# Patient Record
Sex: Female | Born: 1985 | Race: White | Hispanic: No | Marital: Married | Smoking: Current every day smoker
Health system: Southern US, Community
[De-identification: ages and names within clinical notes are randomized; demographics above are authoritative.]

## PROBLEM LIST (undated history)

## (undated) DIAGNOSIS — K219 Gastro-esophageal reflux disease without esophagitis: Secondary | ICD-10-CM

## (undated) DIAGNOSIS — I951 Orthostatic hypotension: Secondary | ICD-10-CM

## (undated) DIAGNOSIS — F419 Anxiety disorder, unspecified: Secondary | ICD-10-CM

## (undated) DIAGNOSIS — Z1509 Genetic susceptibility to other malignant neoplasm: Secondary | ICD-10-CM

## (undated) DIAGNOSIS — K449 Diaphragmatic hernia without obstruction or gangrene: Secondary | ICD-10-CM

## (undated) DIAGNOSIS — F329 Major depressive disorder, single episode, unspecified: Secondary | ICD-10-CM

## (undated) DIAGNOSIS — R51 Headache: Secondary | ICD-10-CM

## (undated) DIAGNOSIS — C801 Malignant (primary) neoplasm, unspecified: Secondary | ICD-10-CM

## (undated) DIAGNOSIS — F32A Depression, unspecified: Secondary | ICD-10-CM

## (undated) DIAGNOSIS — K589 Irritable bowel syndrome without diarrhea: Secondary | ICD-10-CM

## (undated) DIAGNOSIS — L309 Dermatitis, unspecified: Secondary | ICD-10-CM

## (undated) DIAGNOSIS — K2970 Gastritis, unspecified, without bleeding: Secondary | ICD-10-CM

## (undated) DIAGNOSIS — E876 Hypokalemia: Secondary | ICD-10-CM

## (undated) DIAGNOSIS — G8929 Other chronic pain: Secondary | ICD-10-CM

## (undated) DIAGNOSIS — R000 Tachycardia, unspecified: Secondary | ICD-10-CM

## (undated) DIAGNOSIS — K5790 Diverticulosis of intestine, part unspecified, without perforation or abscess without bleeding: Secondary | ICD-10-CM

## (undated) DIAGNOSIS — J189 Pneumonia, unspecified organism: Secondary | ICD-10-CM

## (undated) DIAGNOSIS — Z1501 Genetic susceptibility to malignant neoplasm of breast: Secondary | ICD-10-CM

## (undated) DIAGNOSIS — R519 Headache, unspecified: Secondary | ICD-10-CM

## (undated) DIAGNOSIS — D649 Anemia, unspecified: Secondary | ICD-10-CM

## (undated) HISTORY — DX: Irritable bowel syndrome, unspecified: K58.9

## (undated) HISTORY — DX: Depression, unspecified: F32.A

## (undated) HISTORY — DX: Gastro-esophageal reflux disease without esophagitis: K21.9

## (undated) HISTORY — DX: Diverticulosis of intestine, part unspecified, without perforation or abscess without bleeding: K57.90

## (undated) HISTORY — DX: Anemia, unspecified: D64.9

## (undated) HISTORY — DX: Anxiety disorder, unspecified: F41.9

## (undated) HISTORY — DX: Diaphragmatic hernia without obstruction or gangrene: K44.9

## (undated) HISTORY — DX: Other chronic pain: G89.29

## (undated) HISTORY — DX: Malignant (primary) neoplasm, unspecified: C80.1

## (undated) HISTORY — DX: Genetic susceptibility to other malignant neoplasm: Z15.09

## (undated) HISTORY — PX: MASTECTOMY, PARTIAL: SHX709

## (undated) HISTORY — DX: Headache: R51

## (undated) HISTORY — DX: Genetic susceptibility to malignant neoplasm of breast: Z15.01

## (undated) HISTORY — DX: Headache, unspecified: R51.9

## (undated) HISTORY — PX: COSMETIC SURGERY: SHX468

## (undated) HISTORY — DX: Dermatitis, unspecified: L30.9

## (undated) HISTORY — PX: CHOLECYSTECTOMY: SHX55

## (undated) HISTORY — DX: Major depressive disorder, single episode, unspecified: F32.9

---
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
## 2003-01-04 ENCOUNTER — Ambulatory Visit (HOSPITAL_COMMUNITY): Admission: RE | Admit: 2003-01-04 | Discharge: 2003-01-04 | Payer: Self-pay | Admitting: Emergency Medicine

## 2003-01-04 ENCOUNTER — Encounter: Payer: Self-pay | Admitting: Emergency Medicine

## 2003-01-08 ENCOUNTER — Inpatient Hospital Stay (HOSPITAL_COMMUNITY): Admission: AD | Admit: 2003-01-08 | Discharge: 2003-01-08 | Payer: Self-pay | Admitting: Obstetrics and Gynecology

## 2003-02-15 ENCOUNTER — Inpatient Hospital Stay (HOSPITAL_COMMUNITY): Admission: AD | Admit: 2003-02-15 | Discharge: 2003-02-15 | Payer: Self-pay | Admitting: Obstetrics and Gynecology

## 2003-03-09 ENCOUNTER — Inpatient Hospital Stay (HOSPITAL_COMMUNITY): Admission: AD | Admit: 2003-03-09 | Discharge: 2003-03-09 | Payer: Self-pay | Admitting: Obstetrics and Gynecology

## 2003-03-22 ENCOUNTER — Inpatient Hospital Stay (HOSPITAL_COMMUNITY): Admission: AD | Admit: 2003-03-22 | Discharge: 2003-03-25 | Payer: Self-pay | Admitting: Obstetrics and Gynecology

## 2003-04-27 ENCOUNTER — Other Ambulatory Visit: Admission: RE | Admit: 2003-04-27 | Discharge: 2003-04-27 | Payer: Self-pay | Admitting: Obstetrics and Gynecology

## 2003-05-26 ENCOUNTER — Encounter (INDEPENDENT_AMBULATORY_CARE_PROVIDER_SITE_OTHER): Payer: Self-pay | Admitting: *Deleted

## 2003-05-26 ENCOUNTER — Ambulatory Visit (HOSPITAL_COMMUNITY): Admission: RE | Admit: 2003-05-26 | Discharge: 2003-05-26 | Payer: Self-pay | Admitting: Emergency Medicine

## 2003-05-26 ENCOUNTER — Encounter: Payer: Self-pay | Admitting: Emergency Medicine

## 2003-09-30 ENCOUNTER — Other Ambulatory Visit: Admission: RE | Admit: 2003-09-30 | Discharge: 2003-09-30 | Payer: Self-pay | Admitting: Obstetrics and Gynecology

## 2004-01-11 ENCOUNTER — Emergency Department (HOSPITAL_COMMUNITY): Admission: EM | Admit: 2004-01-11 | Discharge: 2004-01-11 | Payer: Self-pay | Admitting: Emergency Medicine

## 2004-03-19 ENCOUNTER — Emergency Department (HOSPITAL_COMMUNITY): Admission: EM | Admit: 2004-03-19 | Discharge: 2004-03-20 | Payer: Self-pay | Admitting: Emergency Medicine

## 2004-08-20 ENCOUNTER — Emergency Department (HOSPITAL_COMMUNITY): Admission: EM | Admit: 2004-08-20 | Discharge: 2004-08-21 | Payer: Self-pay | Admitting: Emergency Medicine

## 2004-08-22 ENCOUNTER — Emergency Department (HOSPITAL_COMMUNITY): Admission: EM | Admit: 2004-08-22 | Discharge: 2004-08-22 | Payer: Self-pay | Admitting: Emergency Medicine

## 2004-10-10 ENCOUNTER — Other Ambulatory Visit: Admission: RE | Admit: 2004-10-10 | Discharge: 2004-10-10 | Payer: Self-pay | Admitting: Obstetrics and Gynecology

## 2004-12-21 ENCOUNTER — Inpatient Hospital Stay (HOSPITAL_COMMUNITY): Admission: AD | Admit: 2004-12-21 | Discharge: 2004-12-21 | Payer: Self-pay | Admitting: Obstetrics and Gynecology

## 2005-02-04 ENCOUNTER — Inpatient Hospital Stay (HOSPITAL_COMMUNITY): Admission: AD | Admit: 2005-02-04 | Discharge: 2005-02-04 | Payer: Self-pay | Admitting: Obstetrics and Gynecology

## 2005-04-05 ENCOUNTER — Inpatient Hospital Stay (HOSPITAL_COMMUNITY): Admission: RE | Admit: 2005-04-05 | Discharge: 2005-04-08 | Payer: Self-pay | Admitting: Obstetrics and Gynecology

## 2005-05-17 ENCOUNTER — Other Ambulatory Visit: Admission: RE | Admit: 2005-05-17 | Discharge: 2005-05-17 | Payer: Self-pay | Admitting: Obstetrics and Gynecology

## 2005-09-27 ENCOUNTER — Inpatient Hospital Stay (HOSPITAL_COMMUNITY): Admission: AD | Admit: 2005-09-27 | Discharge: 2005-09-27 | Payer: Self-pay | Admitting: Obstetrics and Gynecology

## 2006-01-19 ENCOUNTER — Emergency Department (HOSPITAL_COMMUNITY): Admission: EM | Admit: 2006-01-19 | Discharge: 2006-01-20 | Payer: Self-pay | Admitting: Emergency Medicine

## 2006-01-21 ENCOUNTER — Emergency Department (HOSPITAL_COMMUNITY): Admission: EM | Admit: 2006-01-21 | Discharge: 2006-01-22 | Payer: Self-pay | Admitting: Emergency Medicine

## 2006-06-16 ENCOUNTER — Emergency Department (HOSPITAL_COMMUNITY): Admission: EM | Admit: 2006-06-16 | Discharge: 2006-06-16 | Payer: Self-pay | Admitting: Family Medicine

## 2006-07-08 ENCOUNTER — Emergency Department (HOSPITAL_COMMUNITY): Admission: EM | Admit: 2006-07-08 | Discharge: 2006-07-08 | Payer: Self-pay | Admitting: Family Medicine

## 2006-08-18 ENCOUNTER — Emergency Department (HOSPITAL_COMMUNITY): Admission: EM | Admit: 2006-08-18 | Discharge: 2006-08-18 | Payer: Self-pay | Admitting: Family Medicine

## 2006-08-19 ENCOUNTER — Inpatient Hospital Stay (HOSPITAL_COMMUNITY): Admission: EM | Admit: 2006-08-19 | Discharge: 2006-08-28 | Payer: Self-pay | Admitting: Emergency Medicine

## 2006-08-19 ENCOUNTER — Ambulatory Visit: Payer: Self-pay | Admitting: Internal Medicine

## 2006-08-29 ENCOUNTER — Encounter (INDEPENDENT_AMBULATORY_CARE_PROVIDER_SITE_OTHER): Payer: Self-pay | Admitting: Unknown Physician Specialty

## 2006-09-11 ENCOUNTER — Ambulatory Visit: Payer: Self-pay | Admitting: Internal Medicine

## 2006-09-11 ENCOUNTER — Encounter (INDEPENDENT_AMBULATORY_CARE_PROVIDER_SITE_OTHER): Payer: Self-pay | Admitting: Unknown Physician Specialty

## 2006-09-25 ENCOUNTER — Ambulatory Visit: Payer: Self-pay | Admitting: Internal Medicine

## 2006-09-26 ENCOUNTER — Ambulatory Visit: Payer: Self-pay | Admitting: Internal Medicine

## 2006-09-27 ENCOUNTER — Encounter (INDEPENDENT_AMBULATORY_CARE_PROVIDER_SITE_OTHER): Payer: Self-pay | Admitting: Unknown Physician Specialty

## 2006-10-23 ENCOUNTER — Emergency Department (HOSPITAL_COMMUNITY): Admission: EM | Admit: 2006-10-23 | Discharge: 2006-10-23 | Payer: Self-pay | Admitting: Emergency Medicine

## 2006-10-24 ENCOUNTER — Ambulatory Visit: Payer: Self-pay | Admitting: Internal Medicine

## 2006-10-31 ENCOUNTER — Ambulatory Visit: Payer: Self-pay | Admitting: Internal Medicine

## 2006-11-08 ENCOUNTER — Ambulatory Visit: Payer: Self-pay | Admitting: Internal Medicine

## 2006-12-13 DIAGNOSIS — F1910 Other psychoactive substance abuse, uncomplicated: Secondary | ICD-10-CM

## 2006-12-13 DIAGNOSIS — F329 Major depressive disorder, single episode, unspecified: Secondary | ICD-10-CM

## 2006-12-24 ENCOUNTER — Encounter (INDEPENDENT_AMBULATORY_CARE_PROVIDER_SITE_OTHER): Payer: Self-pay | Admitting: Internal Medicine

## 2006-12-24 ENCOUNTER — Ambulatory Visit: Payer: Self-pay | Admitting: Internal Medicine

## 2006-12-24 DIAGNOSIS — R55 Syncope and collapse: Secondary | ICD-10-CM | POA: Insufficient documentation

## 2006-12-24 DIAGNOSIS — R109 Unspecified abdominal pain: Secondary | ICD-10-CM

## 2006-12-24 DIAGNOSIS — R319 Hematuria, unspecified: Secondary | ICD-10-CM | POA: Insufficient documentation

## 2006-12-24 DIAGNOSIS — R1110 Vomiting, unspecified: Secondary | ICD-10-CM

## 2006-12-24 LAB — CONVERTED CEMR LAB
Leukocytes, UA: NEGATIVE
Nitrite: NEGATIVE
Specific Gravity, Urine: 1.009 (ref 1.005–1.03)
Urine Glucose: NEGATIVE mg/dL
pH: 6.5 (ref 5.0–8.0)

## 2007-01-08 ENCOUNTER — Telehealth: Payer: Self-pay | Admitting: *Deleted

## 2007-01-13 ENCOUNTER — Encounter (INDEPENDENT_AMBULATORY_CARE_PROVIDER_SITE_OTHER): Payer: Self-pay | Admitting: Unknown Physician Specialty

## 2007-01-15 ENCOUNTER — Encounter (INDEPENDENT_AMBULATORY_CARE_PROVIDER_SITE_OTHER): Payer: Self-pay | Admitting: Unknown Physician Specialty

## 2007-02-03 ENCOUNTER — Encounter (INDEPENDENT_AMBULATORY_CARE_PROVIDER_SITE_OTHER): Payer: Self-pay | Admitting: Unknown Physician Specialty

## 2007-02-03 ENCOUNTER — Ambulatory Visit: Payer: Self-pay | Admitting: Internal Medicine

## 2007-02-03 DIAGNOSIS — L708 Other acne: Secondary | ICD-10-CM

## 2007-02-03 DIAGNOSIS — L709 Acne, unspecified: Secondary | ICD-10-CM | POA: Insufficient documentation

## 2007-02-03 DIAGNOSIS — R635 Abnormal weight gain: Secondary | ICD-10-CM

## 2007-02-03 LAB — CONVERTED CEMR LAB
ALT: <8 units/L (ref 0–35)
AST: 12 units/L (ref 0–37)
Alkaline Phosphatase: 44 units/L (ref 39–117)
Beta hcg, urine, semiquantitative: NEGATIVE
Bilirubin, Direct: 0.1 mg/dL (ref 0.0–0.3)
Hemoglobin: 11.4 g/dL — ABNORMAL LOW (ref 12.0–15.0)
Indirect Bilirubin: 0.3 mg/dL (ref 0.0–0.9)
RBC: 4.35 M/uL (ref 3.87–5.11)
TSH: 0.514 microintl units/mL (ref 0.350–5.50)
Total Bilirubin: 0.4 mg/dL (ref 0.3–1.2)
WBC: 6.2 10*3/uL (ref 4.0–10.5)

## 2007-02-11 ENCOUNTER — Encounter (INDEPENDENT_AMBULATORY_CARE_PROVIDER_SITE_OTHER): Payer: Self-pay | Admitting: Unknown Physician Specialty

## 2007-03-06 ENCOUNTER — Ambulatory Visit: Payer: Self-pay | Admitting: Gastroenterology

## 2007-03-06 ENCOUNTER — Ambulatory Visit: Payer: Self-pay | Admitting: Internal Medicine

## 2007-03-11 ENCOUNTER — Telehealth (INDEPENDENT_AMBULATORY_CARE_PROVIDER_SITE_OTHER): Payer: Self-pay | Admitting: Internal Medicine

## 2007-03-11 ENCOUNTER — Emergency Department (HOSPITAL_COMMUNITY): Admission: EM | Admit: 2007-03-11 | Discharge: 2007-03-12 | Payer: Self-pay | Admitting: Emergency Medicine

## 2007-03-12 ENCOUNTER — Emergency Department (HOSPITAL_COMMUNITY): Admission: EM | Admit: 2007-03-12 | Discharge: 2007-03-13 | Payer: Self-pay | Admitting: Emergency Medicine

## 2007-03-13 ENCOUNTER — Encounter (INDEPENDENT_AMBULATORY_CARE_PROVIDER_SITE_OTHER): Payer: Self-pay | Admitting: *Deleted

## 2007-03-13 ENCOUNTER — Ambulatory Visit: Payer: Self-pay | Admitting: Internal Medicine

## 2007-03-13 LAB — CONVERTED CEMR LAB
Albumin: 3.8 g/dL (ref 3.5–5.2)
Amphetamine Screen, Ur: NEGATIVE
Anti Nuclear Antibody(ANA): NEGATIVE
BUN: 8 mg/dL (ref 6–23)
Barbiturate Quant, Ur: NEGATIVE
CO2: 24 meq/L (ref 19–32)
Cocaine Metabolites: NEGATIVE
Eosinophils Relative: 0 % (ref 0–5)
Glucose, Bld: 106 mg/dL — ABNORMAL HIGH (ref 70–99)
HCT: 34.6 % — ABNORMAL LOW (ref 36.0–46.0)
Hemoglobin: 11.8 g/dL — ABNORMAL LOW (ref 12.0–15.0)
Leukocytes, UA: NEGATIVE
Lymphocytes Relative: 18 % (ref 12–46)
Lymphs Abs: 2.5 10*3/uL (ref 0.7–3.3)
Marijuana Metabolite: NEGATIVE
Microalb Creat Ratio: 6.3 mg/g (ref 0.0–30.0)
Monocytes Relative: 5 % (ref 3–11)
Nitrite: NEGATIVE
Opiates: NEGATIVE
Platelets: 276 10*3/uL (ref 150–400)
Protein, ur: NEGATIVE mg/dL
RBC: 4.38 M/uL (ref 3.87–5.11)
Sodium: 136 meq/L (ref 135–145)
Total Bilirubin: 0.8 mg/dL (ref 0.3–1.2)
Total Protein: 6.8 g/dL (ref 6.0–8.3)
WBC: 13.9 10*3/uL — ABNORMAL HIGH (ref 4.0–10.5)

## 2007-03-17 ENCOUNTER — Encounter (INDEPENDENT_AMBULATORY_CARE_PROVIDER_SITE_OTHER): Payer: Self-pay | Admitting: *Deleted

## 2007-03-17 ENCOUNTER — Ambulatory Visit: Payer: Self-pay | Admitting: Hospitalist

## 2007-03-17 LAB — CONVERTED CEMR LAB
AST: 12 units/L (ref 0–37)
Albumin: 3.8 g/dL (ref 3.5–5.2)
Amphetamine Screen, Ur: NEGATIVE
BUN: 7 mg/dL (ref 6–23)
Basophils Relative: 0 % (ref 0–1)
Calcium: 8.9 mg/dL (ref 8.4–10.5)
Chloride: 106 meq/L (ref 96–112)
Cocaine Metabolites: NEGATIVE
Creatinine, Urine: 151.7 mg/dL
Ketones, ur: NEGATIVE mg/dL
Lymphs Abs: 2.1 10*3/uL (ref 0.7–3.3)
MCHC: 33.4 g/dL (ref 30.0–36.0)
Marijuana Metabolite: NEGATIVE
Microalb, Ur: 0.85 mg/dL (ref 0.00–1.89)
Monocytes Relative: 6 % (ref 3–11)
Neutro Abs: 2.2 10*3/uL (ref 1.7–7.7)
Neutrophils Relative %: 46 % (ref 43–77)
Nitrite: NEGATIVE
Opiates: NEGATIVE
Phencyclidine (PCP): NEGATIVE
Potassium: 3.5 meq/L (ref 3.5–5.3)
Protein, ur: NEGATIVE mg/dL
RBC: 4.2 M/uL (ref 3.87–5.11)
Specific Gravity, Urine: 1.018 (ref 1.005–1.03)
Total Protein: 6.6 g/dL (ref 6.0–8.3)
Urobilinogen, UA: 0.2 (ref 0.0–1.0)
WBC: 4.7 10*3/uL (ref 4.0–10.5)

## 2007-03-18 ENCOUNTER — Encounter (INDEPENDENT_AMBULATORY_CARE_PROVIDER_SITE_OTHER): Payer: Self-pay | Admitting: *Deleted

## 2007-03-18 ENCOUNTER — Ambulatory Visit: Payer: Self-pay | Admitting: Internal Medicine

## 2007-03-18 LAB — CONVERTED CEMR LAB
GC Probe Amp, Genital: NEGATIVE
Total CK: 96 units/L (ref 7–177)

## 2007-03-19 ENCOUNTER — Encounter (INDEPENDENT_AMBULATORY_CARE_PROVIDER_SITE_OTHER): Payer: Self-pay | Admitting: *Deleted

## 2007-03-19 LAB — CONVERTED CEMR LAB: Preg, Serum: NEGATIVE

## 2007-03-21 ENCOUNTER — Ambulatory Visit: Payer: Self-pay | Admitting: Hospitalist

## 2007-03-27 ENCOUNTER — Encounter (INDEPENDENT_AMBULATORY_CARE_PROVIDER_SITE_OTHER): Payer: Self-pay | Admitting: *Deleted

## 2007-04-03 ENCOUNTER — Telehealth: Payer: Self-pay | Admitting: *Deleted

## 2007-04-03 ENCOUNTER — Ambulatory Visit (HOSPITAL_COMMUNITY): Admission: RE | Admit: 2007-04-03 | Discharge: 2007-04-03 | Payer: Self-pay | Admitting: Internal Medicine

## 2007-04-18 ENCOUNTER — Encounter: Admission: RE | Admit: 2007-04-18 | Discharge: 2007-04-18 | Payer: Self-pay | Admitting: General Surgery

## 2007-04-24 ENCOUNTER — Encounter (INDEPENDENT_AMBULATORY_CARE_PROVIDER_SITE_OTHER): Payer: Self-pay | Admitting: Interventional Radiology

## 2007-04-24 ENCOUNTER — Other Ambulatory Visit: Admission: RE | Admit: 2007-04-24 | Discharge: 2007-04-24 | Payer: Self-pay | Admitting: Interventional Radiology

## 2007-04-24 ENCOUNTER — Encounter: Admission: RE | Admit: 2007-04-24 | Discharge: 2007-04-24 | Payer: Self-pay | Admitting: General Surgery

## 2007-04-24 ENCOUNTER — Emergency Department (HOSPITAL_COMMUNITY): Admission: EM | Admit: 2007-04-24 | Discharge: 2007-04-24 | Payer: Self-pay | Admitting: Emergency Medicine

## 2007-04-30 ENCOUNTER — Ambulatory Visit (HOSPITAL_COMMUNITY): Admission: RE | Admit: 2007-04-30 | Discharge: 2007-05-01 | Payer: Self-pay | Admitting: General Surgery

## 2007-04-30 ENCOUNTER — Encounter (INDEPENDENT_AMBULATORY_CARE_PROVIDER_SITE_OTHER): Payer: Self-pay | Admitting: General Surgery

## 2007-05-05 ENCOUNTER — Encounter (INDEPENDENT_AMBULATORY_CARE_PROVIDER_SITE_OTHER): Payer: Self-pay | Admitting: *Deleted

## 2007-08-18 ENCOUNTER — Encounter (INDEPENDENT_AMBULATORY_CARE_PROVIDER_SITE_OTHER): Payer: Self-pay | Admitting: Infectious Diseases

## 2007-08-18 ENCOUNTER — Ambulatory Visit: Payer: Self-pay | Admitting: Internal Medicine

## 2007-08-18 DIAGNOSIS — R1084 Generalized abdominal pain: Secondary | ICD-10-CM | POA: Insufficient documentation

## 2007-08-18 LAB — CONVERTED CEMR LAB
ALT: <8 units/L (ref 0–35)
Alkaline Phosphatase: 44 units/L (ref 39–117)
Beta hcg, urine, semiquantitative: NEGATIVE
Creatinine, Ser: 0.85 mg/dL (ref 0.40–1.20)
Gardnerella vaginalis: NEGATIVE
Glucose, Bld: 93 mg/dL (ref 70–99)
HCT: 39.1 % (ref 36.0–46.0)
Hemoglobin, Urine: NEGATIVE
Leukocytes, UA: NEGATIVE
MCHC: 32 g/dL (ref 30.0–36.0)
MCV: 84.8 fL (ref 78.0–100.0)
Nitrite: NEGATIVE
Platelets: 245 10*3/uL (ref 150–400)
Sodium: 140 meq/L (ref 135–145)
Specific Gravity, Urine: 1.009 (ref 1.005–1.03)
Total Bilirubin: 0.6 mg/dL (ref 0.3–1.2)
Total Protein: 7.5 g/dL (ref 6.0–8.3)
Trichomonal Vaginitis: NEGATIVE
Urine Glucose: NEGATIVE mg/dL
pH: 6 (ref 5.0–8.0)

## 2007-08-20 ENCOUNTER — Telehealth: Payer: Self-pay | Admitting: *Deleted

## 2007-09-20 ENCOUNTER — Emergency Department (HOSPITAL_COMMUNITY): Admission: EM | Admit: 2007-09-20 | Discharge: 2007-09-20 | Payer: Self-pay | Admitting: Emergency Medicine

## 2007-09-22 ENCOUNTER — Ambulatory Visit: Payer: Self-pay | Admitting: *Deleted

## 2007-09-22 ENCOUNTER — Encounter (INDEPENDENT_AMBULATORY_CARE_PROVIDER_SITE_OTHER): Payer: Self-pay | Admitting: *Deleted

## 2007-09-22 DIAGNOSIS — R519 Headache, unspecified: Secondary | ICD-10-CM | POA: Insufficient documentation

## 2007-09-22 DIAGNOSIS — Z87898 Personal history of other specified conditions: Secondary | ICD-10-CM | POA: Insufficient documentation

## 2007-09-22 DIAGNOSIS — R51 Headache: Secondary | ICD-10-CM | POA: Insufficient documentation

## 2007-09-22 DIAGNOSIS — Z8659 Personal history of other mental and behavioral disorders: Secondary | ICD-10-CM | POA: Insufficient documentation

## 2007-09-22 DIAGNOSIS — M549 Dorsalgia, unspecified: Secondary | ICD-10-CM | POA: Insufficient documentation

## 2007-09-25 LAB — CONVERTED CEMR LAB
Amphetamine Screen, Ur: NEGATIVE
Barbiturate Quant, Ur: NEGATIVE
Benzodiazepines.: NEGATIVE
CO2: 23 meq/L (ref 19–32)
Calcium: 8.7 mg/dL (ref 8.4–10.5)
Cocaine Metabolites: NEGATIVE
Creatinine, Ser: 0.83 mg/dL (ref 0.40–1.20)
Creatinine,U: 87.8 mg/dL
Phencyclidine (PCP): NEGATIVE
Sodium: 140 meq/L (ref 135–145)

## 2007-11-01 ENCOUNTER — Emergency Department (HOSPITAL_COMMUNITY): Admission: EM | Admit: 2007-11-01 | Discharge: 2007-11-02 | Payer: Self-pay | Admitting: Emergency Medicine

## 2007-11-03 ENCOUNTER — Telehealth (INDEPENDENT_AMBULATORY_CARE_PROVIDER_SITE_OTHER): Payer: Self-pay | Admitting: *Deleted

## 2007-11-04 ENCOUNTER — Telehealth (INDEPENDENT_AMBULATORY_CARE_PROVIDER_SITE_OTHER): Payer: Self-pay | Admitting: *Deleted

## 2007-11-05 ENCOUNTER — Emergency Department (HOSPITAL_COMMUNITY): Admission: EM | Admit: 2007-11-05 | Discharge: 2007-11-06 | Payer: Self-pay | Admitting: Emergency Medicine

## 2007-11-09 ENCOUNTER — Emergency Department (HOSPITAL_COMMUNITY): Admission: EM | Admit: 2007-11-09 | Discharge: 2007-11-09 | Payer: Self-pay | Admitting: Emergency Medicine

## 2007-12-01 ENCOUNTER — Ambulatory Visit: Payer: Self-pay | Admitting: Internal Medicine

## 2007-12-01 LAB — CONVERTED CEMR LAB: hCG, Beta Chain, Quant, S: <0.5 milliintl units/mL

## 2008-02-02 ENCOUNTER — Emergency Department (HOSPITAL_COMMUNITY): Admission: EM | Admit: 2008-02-02 | Discharge: 2008-02-03 | Payer: Self-pay | Admitting: Emergency Medicine

## 2008-02-22 ENCOUNTER — Inpatient Hospital Stay (HOSPITAL_COMMUNITY): Admission: AD | Admit: 2008-02-22 | Discharge: 2008-02-22 | Payer: Self-pay | Admitting: Obstetrics and Gynecology

## 2008-03-17 ENCOUNTER — Inpatient Hospital Stay (HOSPITAL_COMMUNITY): Admission: AD | Admit: 2008-03-17 | Discharge: 2008-03-17 | Payer: Self-pay | Admitting: Obstetrics and Gynecology

## 2008-04-07 ENCOUNTER — Encounter: Admission: RE | Admit: 2008-04-07 | Discharge: 2008-04-07 | Payer: Self-pay | Admitting: Endocrinology

## 2008-04-08 ENCOUNTER — Inpatient Hospital Stay (HOSPITAL_COMMUNITY): Admission: AD | Admit: 2008-04-08 | Discharge: 2008-04-08 | Payer: Self-pay | Admitting: Obstetrics and Gynecology

## 2008-04-12 ENCOUNTER — Ambulatory Visit (HOSPITAL_COMMUNITY): Admission: RE | Admit: 2008-04-12 | Discharge: 2008-04-12 | Payer: Self-pay | Admitting: Endocrinology

## 2008-04-12 ENCOUNTER — Encounter (INDEPENDENT_AMBULATORY_CARE_PROVIDER_SITE_OTHER): Payer: Self-pay | Admitting: Interventional Radiology

## 2008-04-15 ENCOUNTER — Inpatient Hospital Stay (HOSPITAL_COMMUNITY): Admission: AD | Admit: 2008-04-15 | Discharge: 2008-04-15 | Payer: Self-pay | Admitting: Obstetrics and Gynecology

## 2008-04-15 ENCOUNTER — Ambulatory Visit: Payer: Self-pay | Admitting: Internal Medicine

## 2008-05-23 ENCOUNTER — Inpatient Hospital Stay (HOSPITAL_COMMUNITY): Admission: AD | Admit: 2008-05-23 | Discharge: 2008-05-23 | Payer: Self-pay | Admitting: Obstetrics and Gynecology

## 2008-06-06 ENCOUNTER — Emergency Department (HOSPITAL_COMMUNITY): Admission: EM | Admit: 2008-06-06 | Discharge: 2008-06-07 | Payer: Self-pay | Admitting: Emergency Medicine

## 2008-06-06 ENCOUNTER — Emergency Department (HOSPITAL_COMMUNITY): Admission: EM | Admit: 2008-06-06 | Discharge: 2008-06-06 | Payer: Self-pay | Admitting: Family Medicine

## 2008-06-21 ENCOUNTER — Telehealth: Payer: Self-pay | Admitting: Internal Medicine

## 2008-08-21 ENCOUNTER — Inpatient Hospital Stay (HOSPITAL_COMMUNITY): Admission: AD | Admit: 2008-08-21 | Discharge: 2008-08-21 | Payer: Self-pay | Admitting: Obstetrics and Gynecology

## 2008-09-12 ENCOUNTER — Inpatient Hospital Stay (HOSPITAL_COMMUNITY): Admission: AD | Admit: 2008-09-12 | Discharge: 2008-09-13 | Payer: Self-pay | Admitting: Obstetrics and Gynecology

## 2008-09-20 ENCOUNTER — Inpatient Hospital Stay (HOSPITAL_COMMUNITY): Admission: AD | Admit: 2008-09-20 | Discharge: 2008-09-20 | Payer: Self-pay | Admitting: Obstetrics and Gynecology

## 2008-09-23 ENCOUNTER — Encounter: Admission: RE | Admit: 2008-09-23 | Discharge: 2008-09-23 | Payer: Self-pay | Admitting: Endocrinology

## 2008-10-01 ENCOUNTER — Inpatient Hospital Stay (HOSPITAL_COMMUNITY): Admission: AD | Admit: 2008-10-01 | Discharge: 2008-10-01 | Payer: Self-pay | Admitting: Obstetrics and Gynecology

## 2008-10-03 ENCOUNTER — Inpatient Hospital Stay (HOSPITAL_COMMUNITY): Admission: AD | Admit: 2008-10-03 | Discharge: 2008-10-03 | Payer: Self-pay | Admitting: Obstetrics and Gynecology

## 2008-10-11 ENCOUNTER — Inpatient Hospital Stay (HOSPITAL_COMMUNITY): Admission: RE | Admit: 2008-10-11 | Discharge: 2008-10-14 | Payer: Self-pay | Admitting: Obstetrics and Gynecology

## 2008-10-11 ENCOUNTER — Encounter (INDEPENDENT_AMBULATORY_CARE_PROVIDER_SITE_OTHER): Payer: Self-pay | Admitting: Obstetrics and Gynecology

## 2008-11-05 IMAGING — NM NM HEPATO W/GB/PHARM/[PERSON_NAME]
2 series · 12 of 12 positions shown · non-contrast
Comparison: None

CLINICAL DATA: 20-year-old, nausea, vomiting, abdominal pain.  
 NUCLEAR MEDICINE HEPATOBILIARY SCAN WITH EJECTION FRACTION:
TECHNIQUE: Sequential abdominal images were obtained following intravenous injection of radiopharmaceutical.  Sequential images were continued following oral ingestion of 8 oz. half-and-half, and the gallbladder ejection fraction was calculated.
 Radiopharmaceutical:  5.5 mCi Mc-DDm Choletec in 8 oz. of half-and-half.

[Series 1: he hepato · 4.75mm/px · 6 of 60 frames shown (1 of 2)]
[frame 6/60]
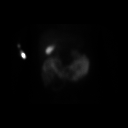
[frame 16/60]
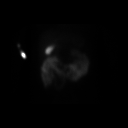
[frame 26/60]
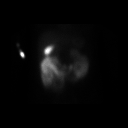
[frame 36/60]
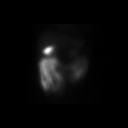
[frame 46/60]
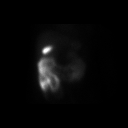
[frame 56/60]
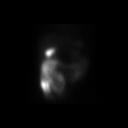

[Series 1: he hepato · 4.75mm/px · 6 of 60 frames shown (2 of 2)]
[frame 6/60]
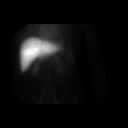
[frame 16/60]
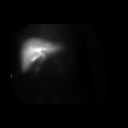
[frame 26/60]
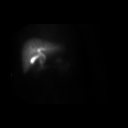
[frame 36/60]
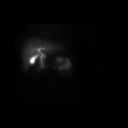
[frame 46/60]
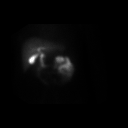
[frame 56/60]
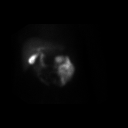

[12 of 12 positions shown; findings below may reference images not displayed]

FINDINGS: There is symmetric uptake in the liver.  There is fairly prompt excretion in the biliary tree which is visualized at 15 minutes.  Activity is seen in the small bowel by 30 minutes.  The gallbladder is seen at 20 minutes.  The patient?s calculated ejection fraction was 31%.  Normal is greater than 50%.
IMPRESSION: 1.  Normal biliary patency. 
 2.  Low gallbladder ejection fraction at 31%.  Normal should be greater than 50%.

## 2009-04-02 ENCOUNTER — Ambulatory Visit: Payer: Self-pay | Admitting: Diagnostic Radiology

## 2009-04-02 ENCOUNTER — Emergency Department (HOSPITAL_BASED_OUTPATIENT_CLINIC_OR_DEPARTMENT_OTHER): Admission: EM | Admit: 2009-04-02 | Discharge: 2009-04-02 | Payer: Self-pay | Admitting: Emergency Medicine

## 2009-09-07 ENCOUNTER — Ambulatory Visit: Payer: Self-pay | Admitting: Interventional Radiology

## 2009-09-07 ENCOUNTER — Emergency Department (HOSPITAL_BASED_OUTPATIENT_CLINIC_OR_DEPARTMENT_OTHER): Admission: EM | Admit: 2009-09-07 | Discharge: 2009-09-07 | Payer: Self-pay | Admitting: Emergency Medicine

## 2009-09-26 ENCOUNTER — Emergency Department (HOSPITAL_BASED_OUTPATIENT_CLINIC_OR_DEPARTMENT_OTHER): Admission: EM | Admit: 2009-09-26 | Discharge: 2009-09-26 | Payer: Self-pay | Admitting: Emergency Medicine

## 2009-09-26 ENCOUNTER — Ambulatory Visit: Payer: Self-pay | Admitting: Diagnostic Radiology

## 2009-10-10 ENCOUNTER — Emergency Department (HOSPITAL_BASED_OUTPATIENT_CLINIC_OR_DEPARTMENT_OTHER): Admission: EM | Admit: 2009-10-10 | Discharge: 2009-10-10 | Payer: Self-pay | Admitting: Emergency Medicine

## 2009-10-10 ENCOUNTER — Ambulatory Visit: Payer: Self-pay | Admitting: Diagnostic Radiology

## 2009-11-21 ENCOUNTER — Emergency Department (HOSPITAL_BASED_OUTPATIENT_CLINIC_OR_DEPARTMENT_OTHER): Admission: EM | Admit: 2009-11-21 | Discharge: 2009-11-21 | Payer: Self-pay | Admitting: Emergency Medicine

## 2010-05-16 ENCOUNTER — Ambulatory Visit: Payer: Self-pay | Admitting: Diagnostic Radiology

## 2010-05-16 ENCOUNTER — Emergency Department (HOSPITAL_BASED_OUTPATIENT_CLINIC_OR_DEPARTMENT_OTHER): Admission: EM | Admit: 2010-05-16 | Discharge: 2010-05-16 | Payer: Self-pay | Admitting: Emergency Medicine

## 2010-10-07 ENCOUNTER — Emergency Department (HOSPITAL_BASED_OUTPATIENT_CLINIC_OR_DEPARTMENT_OTHER): Admission: EM | Admit: 2010-10-07 | Discharge: 2010-10-08 | Payer: Self-pay | Admitting: Emergency Medicine

## 2011-02-11 LAB — URINE MICROSCOPIC-ADD ON

## 2011-02-11 LAB — URINALYSIS, ROUTINE W REFLEX MICROSCOPIC
Bilirubin Urine: NEGATIVE
Hgb urine dipstick: NEGATIVE
Hgb urine dipstick: NEGATIVE
Nitrite: NEGATIVE
Protein, ur: NEGATIVE mg/dL
Specific Gravity, Urine: 1.022 (ref 1.005–1.030)
Specific Gravity, Urine: 1.023 (ref 1.005–1.030)
Urobilinogen, UA: 1 mg/dL (ref 0.0–1.0)
Urobilinogen, UA: 1 mg/dL (ref 0.0–1.0)
pH: 8.5 — ABNORMAL HIGH (ref 5.0–8.0)

## 2011-02-11 LAB — DIFFERENTIAL
Basophils Absolute: 0 10*3/uL (ref 0.0–0.1)
Basophils Relative: 1 % (ref 0–1)
Monocytes Absolute: 0.3 10*3/uL (ref 0.1–1.0)
Neutro Abs: 7.1 10*3/uL (ref 1.7–7.7)
Neutrophils Relative %: 85 % — ABNORMAL HIGH (ref 43–77)

## 2011-02-11 LAB — BASIC METABOLIC PANEL
BUN: 10 mg/dL (ref 6–23)
CO2: 22 mEq/L (ref 19–32)
Calcium: 9.2 mg/dL (ref 8.4–10.5)
GFR calc non Af Amer: >60 mL/min (ref >60)
Glucose, Bld: 101 mg/dL — ABNORMAL HIGH (ref 70–99)
Sodium: 141 mEq/L (ref 135–145)

## 2011-02-11 LAB — HEPATIC FUNCTION PANEL
AST: 18 U/L (ref 0–37)
Bilirubin, Direct: 0 mg/dL (ref 0.0–0.3)
Total Protein: 8.3 g/dL (ref 6.0–8.3)

## 2011-02-11 LAB — PREGNANCY, URINE

## 2011-02-11 LAB — GC/CHLAMYDIA PROBE AMP, GENITAL: GC Probe Amp, Genital: NEGATIVE

## 2011-02-11 LAB — WET PREP, GENITAL
Trich, Wet Prep: NONE SEEN
Yeast Wet Prep HPF POC: NONE SEEN

## 2011-02-11 LAB — CBC
Hemoglobin: 12.8 g/dL (ref 12.0–15.0)
MCHC: 33.1 g/dL (ref 30.0–36.0)
Platelets: 169 10*3/uL (ref 150–400)
RDW: 13.8 % (ref 11.5–15.5)

## 2011-02-26 LAB — DIFFERENTIAL
Basophils Absolute: 0.1 10*3/uL (ref 0.0–0.1)
Basophils Relative: 1 % (ref 0–1)
Monocytes Absolute: 0.4 10*3/uL (ref 0.1–1.0)
Neutro Abs: 7.1 10*3/uL (ref 1.7–7.7)
Neutrophils Relative %: 78 % — ABNORMAL HIGH (ref 43–77)

## 2011-02-26 LAB — COMPREHENSIVE METABOLIC PANEL
Albumin: 4.5 g/dL (ref 3.5–5.2)
Alkaline Phosphatase: 55 U/L (ref 39–117)
BUN: 10 mg/dL (ref 6–23)
Chloride: 105 mEq/L (ref 96–112)
Glucose, Bld: 108 mg/dL — ABNORMAL HIGH (ref 70–99)
Potassium: 3.6 mEq/L (ref 3.5–5.1)
Total Bilirubin: 0.5 mg/dL (ref 0.3–1.2)

## 2011-02-26 LAB — URINALYSIS, ROUTINE W REFLEX MICROSCOPIC
Glucose, UA: NEGATIVE mg/dL
pH: 6 (ref 5.0–8.0)

## 2011-02-26 LAB — CBC
HCT: 33.8 % — ABNORMAL LOW (ref 36.0–46.0)
Hemoglobin: 11.5 g/dL — ABNORMAL LOW (ref 12.0–15.0)
WBC: 9.1 10*3/uL (ref 4.0–10.5)

## 2011-02-28 LAB — URINALYSIS, ROUTINE W REFLEX MICROSCOPIC
Bilirubin Urine: NEGATIVE
Bilirubin Urine: NEGATIVE
Glucose, UA: NEGATIVE mg/dL
Ketones, ur: NEGATIVE mg/dL
Ketones, ur: NEGATIVE mg/dL
Leukocytes, UA: NEGATIVE
Nitrite: NEGATIVE
Protein, ur: NEGATIVE mg/dL
Protein, ur: NEGATIVE mg/dL

## 2011-02-28 LAB — COMPREHENSIVE METABOLIC PANEL
ALT: 15 U/L (ref 0–35)
Alkaline Phosphatase: 52 U/L (ref 39–117)
BUN: 9 mg/dL (ref 6–23)
CO2: 25 mEq/L (ref 19–32)
Chloride: 106 mEq/L (ref 96–112)
GFR calc non Af Amer: >60 mL/min (ref >60)
Glucose, Bld: 94 mg/dL (ref 70–99)
Potassium: 3.9 mEq/L (ref 3.5–5.1)
Total Bilirubin: 0.5 mg/dL (ref 0.3–1.2)

## 2011-02-28 LAB — CBC
HCT: 33.6 % — ABNORMAL LOW (ref 36.0–46.0)
Hemoglobin: 11 g/dL — ABNORMAL LOW (ref 12.0–15.0)
RBC: 4.29 MIL/uL (ref 3.87–5.11)
RDW: 14.1 % (ref 11.5–15.5)
WBC: 5.2 10*3/uL (ref 4.0–10.5)

## 2011-02-28 LAB — DIFFERENTIAL
Basophils Absolute: 0 10*3/uL (ref 0.0–0.1)
Basophils Relative: 1 % (ref 0–1)
Eosinophils Absolute: 0.1 10*3/uL (ref 0.0–0.7)
Neutro Abs: 2.9 10*3/uL (ref 1.7–7.7)
Neutrophils Relative %: 54 % (ref 43–77)

## 2011-02-28 LAB — PREGNANCY, URINE: Preg Test, Ur: NEGATIVE

## 2011-02-28 LAB — RAPID STREP SCREEN (MED CTR MEBANE ONLY): Streptococcus, Group A Screen (Direct): NEGATIVE

## 2011-03-01 LAB — URINALYSIS, ROUTINE W REFLEX MICROSCOPIC
Bilirubin Urine: NEGATIVE
Glucose, UA: NEGATIVE mg/dL
Hgb urine dipstick: NEGATIVE
Specific Gravity, Urine: 1.016 (ref 1.005–1.030)
Urobilinogen, UA: 0.2 mg/dL (ref 0.0–1.0)

## 2011-03-06 LAB — COMPREHENSIVE METABOLIC PANEL
ALT: 11 U/L (ref 0–35)
CO2: 25 mEq/L (ref 19–32)
Calcium: 9.4 mg/dL (ref 8.4–10.5)
Chloride: 104 mEq/L (ref 96–112)
Creatinine, Ser: 0.9 mg/dL (ref 0.4–1.2)
GFR calc non Af Amer: >60 mL/min (ref >60)
Glucose, Bld: 88 mg/dL (ref 70–99)
Sodium: 138 mEq/L (ref 135–145)
Total Bilirubin: 0.9 mg/dL (ref 0.3–1.2)

## 2011-03-06 LAB — CBC
Hemoglobin: 11 g/dL — ABNORMAL LOW (ref 12.0–15.0)
MCHC: 34 g/dL (ref 30.0–36.0)
MCV: 77.7 fL — ABNORMAL LOW (ref 78.0–100.0)
RBC: 4.17 MIL/uL (ref 3.87–5.11)

## 2011-03-06 LAB — URINE MICROSCOPIC-ADD ON

## 2011-03-06 LAB — URINALYSIS, ROUTINE W REFLEX MICROSCOPIC
Hgb urine dipstick: NEGATIVE
Ketones, ur: 15 mg/dL — AB
Nitrite: NEGATIVE
Protein, ur: NEGATIVE mg/dL
Specific Gravity, Urine: 1.024 (ref 1.005–1.030)
Urobilinogen, UA: 1 mg/dL (ref 0.0–1.0)

## 2011-03-06 LAB — LIPASE, BLOOD: Lipase: 32 U/L (ref 23–300)

## 2011-03-06 LAB — DIFFERENTIAL
Basophils Absolute: 0 10*3/uL (ref 0.0–0.1)
Eosinophils Absolute: 0.1 10*3/uL (ref 0.0–0.7)
Lymphs Abs: 1.4 10*3/uL (ref 0.7–4.0)
Neutrophils Relative %: 77 % (ref 43–77)

## 2011-03-06 LAB — PREGNANCY, URINE: Preg Test, Ur: NEGATIVE

## 2011-04-10 NOTE — Op Note (Signed)
Meredith Burns, Meredith Burns            ACCOUNT NO.:  [id number]   MEDICAL RECORD NO.:  [id number]          PATIENT TYPE:  INP   LOCATION:                                FACILITY:  [facility]   PHYSICIAN:  Huel Cote, M.D. DATE OF BIRTH:  [birth year]   DATE OF PROCEDURE:  10/11/2008  DATE OF DISCHARGE:                               OPERATIVE REPORT   PREOPERATIVE DIAGNOSES:  1. Term pregnancy at 39 plus weeks, delivered.  2. Previous cesarean section, declined trial of labor.  3. Desires permanent sterility.   POSTOPERATIVE DIAGNOSES:  1. Term pregnancy at 39 plus weeks, delivered.  2. Previous cesarean section, declined trial of labor.  3. Desires permanent sterility.   PROCEDURE:  Repeat low-transverse cesarean section with bilateral tubal  ligation.   SURGEON:  Huel Cote, MD   ASSISTANT:  Malachi Pro. Ambrose Mantle, MD   ANESTHESIA:  Spinal.   FINDINGS:  There is a vigorous female infant in the vertex presentation.  Apgars were 8 and 9, weight was 7 pounds 13 ounces.  Normal uterus,  tubes, and ovaries were noted.   SPECIMENS:  Bilateral fallopian tube segments were sent to pathology.   ESTIMATED BLOOD LOSS:  550 mL.   URINE OUTPUT:  350 mL clear urine.   IV FLUIDS:  3600 mL LR.   PROCEDURE:  The patient was taken to the operating room where spinal  anesthesia was obtained.  She was then prepped and draped in the normal  sterile fashion in the dorsal supine position with a leftward tilt.  With a Foley catheter in place, a Pfannenstiel's skin incision was made  through her preexisting scar and carried through to the underlying layer  of fascia by sharp dissection and Bovie cautery.  The fascia was then  nicked in the midline and the incision was extended laterally with Mayo  scissors.  The inferior aspect was then grasped with Kocher clamps,  elevated and dissected off the underlying rectus muscles.  Superior  aspect was likewise dissected off the rectus muscles.  These were  separated in the midline and the peritoneal cavity entered bluntly.  The  peritoneal incision was then extended both superiorly and inferiorly  with careful attention to avoid both bowel and bladder.  The lower  uterine segment was thus exposed nicely and the Alexis self-retaining  retractor was placed within the incision.  A bladder flap was created  and the bladder pushed away from lower uterine segment.  This was then  incised in transverse fashion and the cavity itself entered bluntly.  This was then extended bluntly and the infant's head delivered  atraumatically after rupture of membranes for clear fluid.  There was a  nuchal cord x1, which was reduced over the head, and nose and mouth were  bulb suctioned.  The remainder of the infant delivered without  difficulty.  Cord was clamped, cut, and infant was handed to waiting  pediatricians.  The uterus was then massaged and the placenta delivered  intact; however,  cleared of all clots and debris with moist lap sponge.  The uterine incision was then repaired in one  layer with a running  locked layer of 1-0 chromic.  Good hemostasis was noted.  Attention was  then turned to the patient's fallopian tubes which bilaterally were  grasped with Babcock clamps.  A small hole was made in the mesosalpinx  with the Bovie and two free ties of 0 plain passed around the 2-3 cm  buckle of tube that was elevated.  Once this was tied off x2 on both  sides, the segment was amputated and handed off to pathology.  The  remaining pedicle was closely inspected and cauterized with Bovie  cautery at the tubal ostia that were exposed.  These were then returned  into the abdomen and good hemostasis noted.  Attention was then returned  to the patient's incision which was found to be completely hemostatic.  All sponge counts were then correct and the gutters were cleared of all  clots and debris.  The Alexis retractor was removed.  The rectus muscles  and  peritoneum were reapproximated with several interrupted sutures of 1-  0 chromic.  The fascia was then closed with 0 Vicryl and the skin was  closed with staples.  Sponge, lap, and needle counts were correct x2 and  the patient was taken to the recovery room in stable condition.      Huel Cote, M.D.  Electronically Signed     KR/MEDQ  D:  10/11/2008  T:  10/11/2008  Job:  [id number]

## 2011-04-10 NOTE — H&P (Signed)
NAMEMAGDALENE, Meredith Burns            ACCOUNT NO.:  [id number]   MEDICAL RECORD NO.:  [id number]          PATIENT TYPE:  INP   LOCATION:                                FACILITY:  [facility]   PHYSICIAN:  Huel Cote, M.D. DATE OF BIRTH:  09/12/1986   DATE OF ADMISSION:  10/11/2008  DATE OF DISCHARGE:                              HISTORY & PHYSICAL   Date of surgery to take place is at the [facility], on  October 11, 2008, at 9 o'clock a.m.   The patient is a 25 year old G3, P 2-0-0-2, who is coming in for a  scheduled repeat low transverse C-section and bilateral tubal ligation.  The patient has a history of 2 previous C-sections and declines trial of  labor and is now at term.  Her prenatal care has been essentially  uncomplicated, despite frequent visits and multiple complaints by the  patient.  Most of these worked up with no serious pathology noted.  The  patient does have some thyroid nodule, which has been evaluated by Dr.  Evlyn Kanner and is felt to be benign.  The patient continues to have some  symptoms pressure on her throat for which she has seen a general  surgeon, who was considering removal of this thyroid and nodule.   PAST OBSTETRICAL HISTORY:  As stated is significant for 2 prior C-  sections.  The first in 2004, she had a female infant of 8 pounds and 8-  1/2 ounces for arrest of descent.  In 2006, she had a female infant of 7  pounds and 13 ounces by repeat C-section.   PAST GYN HISTORY:  Significant for a LEEP with a history of slight  dysplasia.  After that, however, resolved.   PAST SURGICAL HISTORY:  Significant for the C-sections x2 and in 2008,  she had a cholecystectomy.   PAST MEDICAL HISTORY:  The patient has a history of mild asthma, also  was diagnosed with tachycardia and POTS syndrome previously; however,  this has been stable throughout the pregnancy.  She reports a history of  irritable bowel syndrome and also ADD.  The patient did have an  episode  of sepsis within the last 2 years related to an untreated urinary tract  infection that developed into pyelonephritis and generalized sepsis, and  actually had to go into the ICU to recover from this infection.  She  seems to have recovered completely.   ALLERGIES:  CECLOR and NASAL SPRAY, as well as a LATEX sensitivity.   CURRENT MEDICATIONS:  Zoloft 50 mg p.o. daily.   PHYSICAL EXAMINATION:  VITAL SIGNS:  Weighs 176 pounds and blood  pressure 120/70.  CARDIAC:  Regular rate and rhythm.  LUNGS:  Clear.  ABDOMEN:  Gravid and nontender.  PELVIC:  Cervix is 30, fingertip and -3.   She was counseled appropriately regarding the risks and benefits of  repeat cesarean section including bleeding and infection and possible  damage to bowel and bladder.  She understands all of these risks and  declines trial of labor and desires to proceed with a repeat C-section.  She also has  adamantly stated throughout the pregnancy that she wishes  to have a tubal ligation and permanent sterilization performed at the  time of surgery.  We discussed the risk of failure of approximately 1 in  100 and also discussed the risk of ectopic pregnancy should pregnancy  occur.  She understands these risks and we also have discussed in some  detail that this is a permanent procedure and given her young age, there  is a higher chance of regret performing from permanent sterilization.  She states that she has no concerns that this will be the case and  indeed actually did request sterilization with her last pregnancy.  However, it was just 25 years old at that time and this was declined to  be performed.  Therefore, we will proceed with a repeat low transverse C-  section and bilateral tubal ligation as stated and followup of the  patient for a normal postpartum course in the hospital.      Huel Cote, M.D.  Electronically Signed     KR/MEDQ  D:  10/08/2008  T:  10/09/2008  Job:  [id number]

## 2011-04-10 NOTE — Assessment & Plan Note (Signed)
[facility]                         GASTROENTEROLOGY OFFICE NOTE   Meredith Burns                    MRN:          [id number]  DATE:12/01/2007                            DOB:          January 16, 1986    HISTORY:  Meredith Burns presents today with complaints of nausea, vomiting,  abdominal pain and 20-pound weight gain.  She was evaluated in the  office March 06, 2007 for abdominal pain and weight gain; see that  dictation for details.  She was felt to have constipation-predominant  irritable bowel syndrome with a significant component of anxiety and  depression.  She underwent abdominal ultrasound.  She was found to have  some thickening of the gallbladder wall as well as gallbladder polyps.  She also underwent upper endoscopy and colonoscopy.  Colonoscopy  including intubation of the terminal ileum was normal.  Upper endoscopy  was remarkable for a hiatal hernia as well as prolapsing mucosa  consistent with reflux disease.  A hepatobiliary scan was obtained that  revealed decreased gallbladder ejection fraction.  Because of ongoing  complaints, the patient subsequently underwent laparoscopic  cholecystectomy with Dr. Johna Sheriff.  She seemed to do okay for a while.  She has been followed by the mental health clinic [place] on a sporadic  basis.  She has been on several antidepressants.  She was on Prozac, but  had unacceptable weight gain.  She has been on Wellbutrin for a short  period of time, but discontinued this a few weeks ago.  She describes to  me a sensation of fullness after meals.  Although she would induce  vomiting the past for relief, she states she is not doing that  currently.  She complains of pain in the left abdomen.  She seems to be  under a lot of stress with regards to the care of her young children,  her inability to gain employment, and body image issues.  She has not  had fevers or bleeding.   MEDICATIONS:  Her only medications  currently are Florinef and potassium  chloride.   PHYSICAL EXAMINATION:  Physical exam finds a well-appearing female in no  acute distress.  Blood pressure is 112/66, heart rate 68.  Weight is 163 pounds  (increased 1 pound from April 2008).  HEENT:  Sclerae are anicteric.  Conjunctivae are pink.  Oral mucosa is  intact.  No adenopathy.  LUNGS:  Clear.  HEART:  Regular.  ABDOMEN:  Obese and soft with good bowel sounds.  She does complain of  pain with minimal palpation in any portion of the abdomen.  There is no  hernia or mass.  EXTREMITIES:  Without edema.   IMPRESSION:  I am highly suspicious that Meredith Burns's gastrointestinal  complaints are entirely functional in nature.  She is concerned about  weight gain and may have an element of an eating disorder.  She has had  extensive gastrointestinal workup in the past without organic pathology  identified.  As well, empiric cholecystectomy was not helpful.  At this  point, I was candid with her regarding my impression.  I feel that it is  imperative that she  return to mental health and be  followed closely and treated aggressively, as I feel this will be her  best chance for long-term well-being. She is agreeable.     [name], MD  Electronically Signed    JNP/MedQ  DD: 12/01/2007  DT: 12/02/2007  Job #: [id number]   cc:   Jeannett Senior A. Evlyn Kanner, M.D.

## 2011-04-10 NOTE — Discharge Summary (Signed)
Meredith Burns, Meredith Burns            ACCOUNT NO.:  [id number]   MEDICAL RECORD NO.:  [id number]          PATIENT TYPE:  INP   LOCATION:  9112                          FACILITY:  [facility]   PHYSICIAN:  Huel Cote, M.D. DATE OF BIRTH:  [birth year]   DATE OF ADMISSION:  10/11/2008  DATE OF DISCHARGE:  10/14/2008                               DISCHARGE SUMMARY   DISCHARGE DIAGNOSES:  1. Term pregnancy at 39 plus weeks.  2. Previous cesarean section, declines trial of labor.  3. Desires permanent sterility.  4. Status post repeat low transverse cesarean section and bilateral      tubal ligation.   DISCHARGE MEDICATIONS:  1. Motrin 600 mg p.o. every 6 hours.  2. Percocet 1-2 tablets p.o. every 4 hours p.r.n.  3. Zoloft 50 mg p.o. nightly.   DISCHARGE FOLLOWUP:  The patient is to follow up in the office in  approximately 2 weeks for incision check and then in 6 weeks for full  postpartum exam.   HOSPITAL COURSE:  The patient is a 25 year old G3, P3 now who came in  for a scheduled repeat low transverse C-section, which took place on  October 11, 2008.  For full history and physical, please see previously  dictated H&P.  She was then admitted for a routine postoperative care  after she had her C-section with no complications and delivered of a  vigorous female infant, Apgars were 8 and 9, weight was 7 pounds 13  ounces.  Per her adamant request, she had a tubal ligation performed at  that time of surgery without difficulty and remained in-house for her  postoperative care.  She did fairly well and on postop day #1, her  hemoglobin was stable at 8.3.  She had begun at 10.  She did have a  little bit of difficulty voiding initially; however, this was rectified  in the evening of postop day #1, and she did fine the remainder of her  stay.  On postop day #3, she was tolerating regular diet.  Her pain was  well controlled on pain medications, and she was voiding and passing gas  without  difficulty.  She was felt stable for discharge to home.  Her  physical showed an afebrile exam.  Incision was clear.  Staples removed  and Steri-Strips placed and appeared intact.  There was no erythema  noted.  She was given instructions on pelvic rest and followup and  voiced understanding.  She will also be given discharge visit from our  office.  She knows the warning signs for postpartum depression and if  her Zoloft does not appear to be working, she will call us.      Huel Cote, M.D.  Electronically Signed     KR/MEDQ  D:  10/14/2008  T:  10/14/2008  Job:  [id number]

## 2011-04-10 NOTE — Op Note (Signed)
NAME:  [name], Burns             ACCOUNT NO.:  [id number]   MEDICAL RECORD NO.:  [id number]          PATIENT TYPE:  OIB   LOCATION:  5729                         FACILITY:  [facility]   PHYSICIAN:  Sharlet Salina T. Hoxworth, M.D.DATE OF BIRTH:  1986-08-03   DATE OF PROCEDURE:  04/30/2007  DATE OF DISCHARGE:                               OPERATIVE REPORT   PRE AND POSTOPERATIVE DIAGNOSIS:  Cholelithiasis, chronic cholecystitis.   PROCEDURE:  Laparoscopic cholecystectomy with intraoperative  cholangiogram.   SURGEON:  Sharlet Salina T. Hoxworth, M.D.   ANESTHESIA:  General.   BRIEF HISTORY:  Meredith Burns is a 25 year old female gravida 2, para  2 who has a history of gradually worsening episodic abdominal pain,  nausea and vomiting.  She had extensive workup by Dr. Yancey Flemings and  essentially the only abnormality is a gallbladder ultrasound showing  some thickening of the gallbladder wall and some nonshadowing apparent  small gallstones.  After discussion with the patient and her family  preoperatively, we elected to proceed with laparoscopic cholecystectomy  with cholangiogram in an effort to improve her symptoms.  The nature of  the procedure, indications, risks of bleeding, infection, bile leak,  bile duct injury and anesthetic complications were discussed understood.  She is now brought to operating room for this procedure.   DESCRIPTION OF OPERATION:  The patient brought out room placed in supine  position on the table and general endotracheal anesthesia was induced.  The abdomen was widely sterilely prepped and draped with Hibiclens.  She  received preoperative antibiotics.  Correct patient and procedure were  verified.  Local anesthesia was used to infiltrate the trocar sites  prior to the incisions.  A 1 cm incision was made in the umbilicus and  dissection carried down to midline fascia which was sharply incised for  1 cm and the peritoneum entered under direct vision.  Through a  mattress  suture of 0-0 Vicryl, the Hassan trocar was placed and pneumoperitoneum  established.  Under direct vision a 10 mm trocar was placed in  subxiphoid area, two 5 mm trocars on the right subcostal margin.  The  gallbladder was visualized and did not appear inflamed.  The fundus  grasped, elevated up over the liver and infundibulum retracted  inferolaterally.  Peritoneum anterior and posterior to Calot's triangle  along the infundibulum was incised and the distal gallbladder thoroughly  dissected.  In Calot's triangle anterior branch cystic artery was  clearly seen coursing onto the gallbladder and this was divided between  two proximal and one distal clip.  The distal gallbladder was dissected  the cystic duct identified, the cyst duct gallbladder junction dissected  360 degrees.  When the anatomy was clear, the cystic duct was clipped at  the gallbladder junction and operative cholangiogram was obtained  through the cystic duct.  This showed good filling of normal bile duct  and intrahepatic ducts with free flow into the duodenum and no filling  defects.  Following instrument counts, cholangiocath was removed.  The  cystic duct was triply clipped proximally divided.  The gallbladder was  then dissected free from its bed  using hook cautery.  A posterior branch  of the cystic artery was clipped well up on the gallbladder fossa.  Gallbladder was detached and removed intact through the umbilicus.  Operative site was inspected for hemostasis which was complete.  Trocars  removed and all CO2 evacuated.  The mattress suture secured at the  umbilicus.  Skin incisions were closed with interrupted subcuticular 4-0  Monocryl and Dermabond.  Sponge and needle counts were correct.  The  patient taken to recovery in good condition.      Lorne Skeens. Hoxworth, M.D.  Electronically Signed     BTH/MEDQ  D:  04/30/2007  T:  05/01/2007  Job:  [id number]

## 2011-04-13 NOTE — Discharge Summary (Signed)
NAMEBRIELLAH, BAIK             ACCOUNT NO.:  [id number]   MEDICAL RECORD NO.:  [id number]          PATIENT TYPE:  INP   LOCATION:  4734                         FACILITY:  [facility]   PHYSICIAN:  Eliseo Gum, M.D.   DATE OF BIRTH:  1986-07-05   DATE OF ADMISSION:  08/19/2006  DATE OF DISCHARGE:  08/28/2006                                 DISCHARGE SUMMARY   DISCHARGE DIAGNOSES:  1. Adrenal insufficiency with acute adrenal crisis.  2. Questionable pyelonephritis  3. Hypokalemia, resolved.  4. Chronic back pain.  5. Tonsillitis.  6. History of gastritis.  7. Recurrent cystitis and pyelonephritis.  8. Irritable bowel syndrome, predominant constipation.  9. Recurrent strep pharyngitis.  10.Depression.   MEDICATIONS ON DISCHARGE:  1. Ciprofloxacin 500 mg p.o. 3 times daily for 9 more days.  2. Flexeril 5 mg p.o. 3 times daily.  3. Protonix 40 mg p.o. 2 times daily.  4. Nexium 40 mg p.o. daily.  The patient was instructed not to take Nexium      at the same time as Protonix.  She was provided with Nexium samples.      If these are ineffective, she should take the Protonix.  5. Paxil 20 mg p.o. daily.  6. Hydrocortisone 40 mg p.o. q.a.m. and 20 mg p.o. q.p.m.  7. Oxycodone 60 mg p.o. 3 times daily p.r.n. pain.  8. Phenergan 5.25 mg p.o. every 4 hours p.r.n. nausea.  9. Percocet 12 tablets.   FOLLOWUP:  The patient has an appointment with Dr. Elvera Lennox in the outpatient  clinic in 6 weeks after discharge.  The patient will call at the beginning  of November to obtain the exact date and time of the appointment.  She also  will have an appointment with Dr. Evlyn Kanner.  I left a message with Dr. Evlyn Kanner  giving him the information about the patient.  Per his recommendation when  he saw her in the hospital, she should have an appointment in 3 weeks.  I  have not gotten any reply for a date and time.  If I do not receive an  appointment time from him, I will change the appointment with Dr.  Elvera Lennox in  the clinic in 6 weeks to an appointment in 3 weeks.  At that time, I will  evaluate the patient and check a CBC a B-Met and evaluate the need for pain  medication and for decreasing the dose of hydrocortisone.   ACTIVITY:  The patient was advised to return to work when tolerated, to  resume activity slowly and to rest as desired.   CONDITION ON DISCHARGE:  Improved.   PROCEDURES:  CT pelvis with contrast (24th of September): no acute pelvic  findings.  The intrauterine device present on the prior examination had been  removed.  Chest x-ray (24th of September): no acute findings.  Chest x-ray (25th of September): PICC line in good position.  MRI brain with and without contrast: negative for acute stroke and showed a  normal intracranial anatomy.  No evidence for pituitary macro or  microadenoma.  Optic chiasm was normally positioned.  No abnormal  enhancement of the brain or meninges.  X-ray spine (28th of September): negative with no compression deformity.  MRI spine without contrast (2nd of October): no evidence for disk  degeneration, disk herniation, vertebral body abnormality or paraspinous  mass.  It was generally unremarkable. The segments that were analyzed by x-  ray and MRI were the thoracic and lumbar regions.   CONSULTATIONS:  Dr. Evlyn Kanner with endocrinology.   CHIEF COMPLAINT:  The patient presented with a chief complaint of vomiting.   HISTORY OF PRESENT ILLNESS:  For full details, please refer to the patient's  chart.  Briefly, the patient is a 25 year old white female with a history of  recurrent UTIs, gastritis, IBS, gallbladder problems and prolonged QT  syndrome who presented to the ED with severe lower back pain.  She reported  developing back pain plus dysuria approximately 2 weeks ago, but it became  significantly worse the day prior to admission.  At that time, she was seen  at urgent care and diagnosed with a UTI and strep throat.  She was sent  home  on Pyridium, Macrobid, doxycycline and ibuprofen.  However, she developed  worsening pain and fever as high as 103.1.  She denied any trauma to account  for her back pain and had not traveled anywhere.  She denied any sick  contacts, and she complained of this nausea and vomiting on the morning of  admission.  She also had a sore throat and a productive cough with yellow  sputum.   REVIEW OF SYSTEMS:  Positive for urinary frequency, dysuria for several  months, headache, dizziness, amenorrhea for 4-5 months (On Depo-Provera),  galactorrhea since her last delivery in May 2006.  She also complains of  chronic fatigue and constipation.  Also, the patient was admitted for  chills, weight loss of 10 pounds in the last 2 weeks, diaphoresis, dyspnea  with back pain, hematuria.  She denies weakness and numbness.   PHYSICAL EXAMINATION:  VITAL SIGNS:  Temperature 101.7, blood pressure  83/47, pulse 120, respiratory rate 18, oxygen saturation 100% on room air.  GENERAL:  The patient was uncomfortable but in no acute distress.  EYES:  PERRLA.  EOMI.  Clear conjunctivae.  ENT:  Moist mucosa.  Swollen tonsils, erythematous with whitish exudate.  NECK:  Tonsillar lymphadenopathy and tenderness.  RESPIRATORY:  Clear to auscultation bilaterally.  CARDIOVASCULAR:  Regular rate and rhythm.  No murmurs, rubs or gallops.  GI:  Positive bowel sounds.  Abdomen is soft, moderately tender, especially  in the right lower quadrant.  No hepatosplenomegaly.  EXTREMITIES:  2+ radial pulses.  No lower extremity edema.  GU:  Bilateral CVA tenderness.  SKIN:  Warm and dry.  LYMPHATICS:  Positive cervical lymphadenopathy bilaterally.  MUSCULOSKELETAL:  Extremely tender lumbar pain.  NEUROLOGIC:  Cranial nerves II-XII intact.  No nuchal rigidity.  Sensation  grossly intact.  There is 4/5 strength in the upper and lower extremities  bilaterally.  PSYCHIATRIC:  Appropriate but somewhat somnolent.  LABORATORIES:   Sodium 134, potassium 3.3, chloride 105, CO2 21, BUN 6,  creatinine 0.8, glucose 114.  Anion gap 8.  Bilirubin 0.8, alkaline  phosphatase 37, SGOT 12, SGPT 8, protein 5.9, albumin 3.1, calcium 9.  Amylase 40, lipase 20.  White blood count 11.6, hemoglobin 11.9, hematocrit  34.9, thrombocytes 154, ANC 9.5, MCV 80.  Urinalysis showed more than 80  ketones, blood negative, nitrites positive, small leukocyte esterase, 3-6  white blood cells, many bacteria and many squamous cells.  A Monospot test  was negative.  EBV antibodies were negative.  Urine pregnancy test was  negative.  CT of the abdomen and pelvis was negative.  Chest x-ray was  negative.   ASSESSMENT/PLAN:  This is a 25 year old white female with hypotension, low  back pain, questionable pyelonephritis.   1. Adrenal crisis likely secondary to urinary infection.  The patient      arrived with symptoms of dysuria, hematuria, back pain, fever, nausea      and vomiting, increased white blood count in her urinalysis and      hypotension, all of these being suggestive of a pyelonephritis      complicated with septic shock.  However, she has a history of intrauterine device implant that was removed a  year prior to discharge, so other diagnoses like endometritis were possible.  Appendicitis, ovarian torsion, ovarian cyst and ruptured ectopic pregnancy  were all possible, but in the light of a normal CT, negative beta hCG and  her being Depo-Provera, they are unlikely). To r/o cardiogenic shock, we  also obtained an EKG, that was normal, because the patient had a history of  syncope and prolonged QT in the past.  We were concerned about possible adrenal  insufficiency and have checked a  cortisol level, and that was low at 1.9.  We also checked an ACTH, and that  was less than 5.  For hypotension, the patient required 3 days on dopamine.  The patient was placed on hydrocortisone.  After starting hydrocortisone,  the patient's blood  pressure started to normalize, and the blood pressure  did not decrease again during her hospital stay.  We have started the  patient on vancomycin and Zosyn empirically for her urinary infection.  We  resuscitated her with normal saline.  We had a PICC line in.  We monitored  I's & O's, and we followed sepsis protocol.  However, after we started the  hydrocortisone and the antibiotics, the patient's vital signs started to  normalize, and she developed no hypotension, no fever and started to feel  well.  To investigate the causes for possible adrenal insufficiency, we also  obtained a prolactin level that was high at 97.  We also obtained a TSH that  was normal at 1.7 to 2.  We obtained MRI of the brain that showed no macro  or micropituitary adenoma.  The prolactin level being high at 97 and then a  repeat prolactin level of 50 would count towards tertiary insufficiency.  Also, a negative MRI would count towards a tertiary adrenal insufficiency. At this point, we could not do a CR[facility] stimulation test as the patient was  already started on hydrocortisone, but Dr. Evlyn Kanner was consulted, and he  suggested that the patient be evaluated and maybe the hydrocortisone to be  decreased and then to get a CRH stimulation as an outpatient.  The patient  was discharged on hydrocortisone p.o.  Also, for her urinary infection, we  changed her to ciprofloxacin, and the patient had no fever and no increased  white blood count.  We discharged her on ciprofloxacin for a total of 14  days.  1. Hypokalemia.  Questionable etiology.  We repleted her potassium several      times in the hospital.  This will need to be followed as an outpatient,      especially since she is on hydrocortisone.  2. Pharyngoamygdalitis.  The patient had group A strep screen that was  negative.  Also, she had an EBV antigen test that was negative.  She      still had tonsillar enlargement, likely secondary to teeth extraction 2       weeks prior to admission, most likely viral.  Also, possible gonococcal      etiology was discussed.  Vaginal cultures were negative, and the      patient denied sexual intercourse in the last 6 months.  3. Depression.  The patient has multiple problems at home.  She is in the      process of a divorce.  She has 2 children, 1 of them with cerebral      palsy and epilepsy.  She is on chronic medication, and she has chronic      back pain.  We started her on Paxil in the hospital, and we will      continue this as an outpatient.  4. Hematemesis.  Most likely due to repeated vomiting or secondary to      ibuprofen as the patient has a history of gastritis.  The patient was      on Protonix in the hospital, and I discharged her on Protonix, but the      patient complained of maybe not being able to pay for it.  I gave her      10 tablets of Nexium and instructed her to try the Nexium first.  When      she finishes the tablets, or if it does not work, she is to start      Protonix again.  The patient said that her symptoms are well controlled      on the Protonix.  5. Back pain.  It is very incapacitating for the patient.  It is very      intense, 10/10.  It had started more than a year ago.  The patient      could locate it right after giving birth to her daughter.  We had      investigated the pain by doing thoracic and lumbar x-rays and also MRI      of these regions, and both tests were negative.  At this point, I think      that it might be chronic spasms secondary to her adrenal insufficiency.      We gave her Flexeril in the hospital, and we have tried Dilaudid and      morphine.  These medications did not work.  However, before discharge,      I switched her to OxyContin 60 mg p.o. b.i.d., which is a very high      dose, but the patient said that it was the first that her pain was      finally controlled after so much time.  So I discharged her on this      medication.  I received many  calls from the pharmacy regarding the     dispensing of oxycodone/OxyContin.  I have called Medicaid, and I have      faxed them a sheet with the patient's information.  They said that per      their protocol, the pharmacy would have this information in 24 hours      and will be able to dispense these tablets.  In the meantime, to make      sure that the patient's pain was covered, I prescribed her 12 tablets      of Percocet to last her for 2 days until  the mother can fill out the      prescription.   VITAL SIGNS ON DISCHARGE:  Maximum temperature 97.1, blood pressure 105/61,  pulse 83, respirations 20, oxygen saturation 93% on room air.   LABORATORIES ON DISCHARGE:  CBG 101.  White blood count 10, hemoglobin 10.4,  hematocrit 30.7, thrombocytes 258, sodium 137, potassium 3.5, chloride 101,  CO2 29, BUN 12, creatinine 0.9, glucose 109, calcium 8.2.  TSH 1.7, free T4  0.8.      Carlus Pavlov, M.D.  Electronically Signed     ______________________________  Eliseo Gum, M.D.    CG/MEDQ  D:  08/29/2006  T:  08/29/2006  Job:  [id number]   cc:   Carlus Pavlov, M.D.  Tera Mater. Evlyn Kanner, M.D.  Eliseo Gum, M.D.

## 2011-04-13 NOTE — Assessment & Plan Note (Signed)
[facility]                            CARDIOLOGY OFFICE NOTE   Meredith Burns, Meredith Burns                    MRN:          [id number]  DATE:10/24/2006                            DOB:          1986-11-21    PRIMARY CARE PHYSICIAN:  Dr. Adrian Prince.   HISTORY OF PRESENT ILLNESS:  Meredith Burns is a 25 year old female patient,  followed by Dr. Graciela Husbands with a history of dysautonomia, who presents to  the office today as an add-on for recurrent syncope.  She was at rest  yesterday when she began to feel nauseated, sweaty, tachycardic and  lightheaded.  She had a syncopal episode and her mother brought her to  the emergency room.  She had some blood work performed.  This revealed a  normal CBC.  Her comprehensive metabolic panel was normal except for a  potassium of 3.1.  She always has chest discomfort before her syncopal  episodes.  She had one set of point-of-care markers and these were  negative, and a urinalysis was also normal.  She felt better after about  an hour.  Today she feels much better.  She had a recent workup with Dr.  Evlyn Kanner to rule out adrenal insufficiency.  She had had an admission to  [facility] back in October of 2007.  It was felt that she had had an  adrenal crisis secondary to urinary tract infection.  As noted, she has  recently heard back from Dr. Evlyn Kanner that she does not have adrenal  insufficiency.  I believe she was treated with steroids for quite some  time after this admission.   CURRENT MEDICATIONS:  None.   PHYSICAL EXAMINATION:  She is a well-developed, well-nourished female in  no distress.  VITAL SIGNS: Blood pressure is 116/76 with a pulse of 76 lying, sitting  114/78 with a pulse of 85, standing 123/77 with a pulse of 101, after 10  minutes 126/80 with a pulse of 106, after 5 minutes 118/78 with a pulse  of 108.  HEENT:  Unremarkable.  NECK:  Without JVD.  CARDIAC:  Normal S1, S2, regular rate and rhythm.  LUNGS:   Clear to auscultation bilaterally without wheezing, rhonchi or  rales.  ABDOMEN:  Soft, nontender.  EXTREMITIES:  Without edema, calves are soft, nontender.  SKIN:  Warm and dry.  NEUROLOGIC:  She is alert and oriented x3, cranial nerves II-XII are  grossly intact.   Electrocardiogram reveals sinus rhythm with a heart rate of 74, normal  axis, QTc 435 milliseconds, no significant change since previous  tracings.   IMPRESSION:  1. Recurrent syncope - likely secondary to dysautonomia.  2. History of borderline long QT syndrome.  3. History of admission to the hospital for adrenal crisis in the      setting of urinary tract infection.      a.     Chronic hematuria.  4. Significant psychosocial stressors.  5. Irritable bowel syndrome.  6. Depression.  7. Acid reflux disease.  8. Hypokalemia.   PLAN:  I discussed the patient's case today with Dr. Ladona Ridgel.  The  patient  would be appropriate for Florinef, and we have started her on  Florinef 0.1 mg b.i.d.  In addition to the Florinef, we will also place  the patient on potassium supplementation with K-Dur 40 mEq daily.  She  did receive some supplementation last night in the ER.  We will get a  BMET in a week to follow up on her sodium, potassium and renal function.  I have asked her to continue to push the fluids and salts at home.  We  will bring her back in followup with Dr. Graciela Husbands in the next couple of  weeks.      Tereso Newcomer, PA-C  Electronically Signed      Doylene Canning. Ladona Ridgel, MD  Electronically Signed   SW/MedQ  DD: 10/24/2006  DT: 10/25/2006  Job #: [id number]   cc:   Jeannett Senior A. Evlyn Kanner, M.D.

## 2011-04-13 NOTE — H&P (Signed)
[facility]  Patient:    Meredith Burns, Meredith Burns Visit Number: [id number] MRN: [id number]          Service Type: MED Location: 1E 0101 01 Attending Physician:  Shelba Flake Dictated by:   Griffith Citron, M.D. Admit Date:  02/02/2002                           History and Physical  ADMISSION DIAGNOSES: 1. Right upper quadrant pain. 2. Leukocytosis.  HISTORY OF PRESENT ILLNESS:  The patient is a 25 year old white female presenting to [facility] Emergency Room earlier in the night, February 02, 2002, with complaints of right upper quadrant abdominal pain.  Onset of symptoms five days ago, January 29, 2002, at which time she had presented to the [facility] Emergency Room.  Evaluation at that time was unremarkable, including a normal physical examination, laboratory, and abdominal/pelvic CT.  The patient was treated with analgesics and discharged.  The patients pain syndrome has continued over the past five days, unrelenting, but variable in intensity. Because of exacerbation today she again came to the emergency room.  Symptoms characterized by sharp, knife-like pain in the right mid abdomen radiating into the right flank.  Associated nausea and vomiting, typically with food emesis.  This offers no relief.  No abdominal distention. Intermittent rigor and chill lasting approximately 15 minutes without documented fever.  No specific exacerbating features.  Thus far the patient has received Toradol and Dilaudid in the emergency room without significant improvement in her symptoms.  Evaluation by Dr. Margretta Ditty has included a physical examination revealing abdominal tenderness and generalized discomfort, with laboratory findings significant for mild leukocytosis.  Chemistries were all normal.  Urinalysis negative including urine hCG.  An abdominal/pelvic CT revealed a possible common bile duct stone in the common hepatic duct with more proximal intrahepatic  ductal dilation. There is no evidence of distal common bile duct dilation, nor are gallstones identified in the gallbladder.  No other abnormality is commented upon.  PAST MEDICAL HISTORY:  The patient has been seen by Dr. Renella Cunas, [facility], pediatric gastroenterologist because of severe constipation, predominant irritable bowel syndrome.  This has been treated empirically with MiraLax without GI evaluation, per patient and mother.  She has also had recurrent nausea and vomiting, frequently with retentive food.  Endoscopy was performed by Dr. Renella Cunas with findings of possible early gastritis.  The patient was placed on Prevacid and treated empirically with Phenergan for these symptoms over the past year.  Despite this, symptoms are ongoing.  ALLERGIES:  CECLOR.  MEDICATIONS:  Prevacid.  Birth control pills.  Phenergan p.r.n.  MiraLax, using rarely.  SOCIAL HISTORY:  The patient is presently home schooling on line.  She was failing in school necessitating this arrangement.  She works at [employer]. Living at home.  Denies alcohol or tobacco use.  FAMILY HISTORY:  Both grandparents with gallbladder disease.  Both parents with peptic ulcer disease.  A 4 year old brother is healthy.  REVIEW OF SYSTEMS:  GENERAL:  Healthy, with normal energy and appetite. GASTROINTESTINAL:  Scant bowel movements due to irritable bowel syndrome. Using MiraLax sparingly.  Denies hematochezia, change in stool caliber.  No diarrheal component.  GYNECOLOGIC:  LMP one week ago.  Sexually active, on birth control pills.  The patient denies any possibility of pregnancy.  No vaginal discharge.  No history of dyspareunia.  Possible diagnosis of pelvic inflammatory disease per history.  GENITOURINARY:  No history of urinary  tract infection, kidney stones.  Remainder of system review is noncontributory.  PHYSICAL EXAMINATION:  GENERAL:  Acutely ill, young white female, teenager.  Alert and oriented  x3. Crying.  VITAL SIGNS:  Stable except for rapid breathing.  Afebrile.  HEENT:  Facial acne rash with "allergic shiners."  Macular erythema.  EOMI. PERRL.  Anicteric sclerae.  Pink conjunctivae, without pallor.  Mouth:  No les of lip, gums, or teeth.  Tongue is pierced.  NECK:  Supple.  No adenopathy, thyromegaly, or bruit.  CHEST:  Clear to auscultation.  Without wheeze or adventitious sound.  CARDIAC:  Regular rhythm.  Grade 1/6 systolic ejection murmur.  ABDOMEN:  Firm.  Bowel sounds active.  Nondistended.  Tender diffusely but mostly in the right mid and right upper quadrant.  No rebound, voluntary guarding present.  ______ not palpable, 10 cm span.  Gallbladder fundus not palpable.  No splenomegaly.  No mass.  PELVIC:  Performed by Dr. Margretta Ditty, negative.  EXTREMITIES:  Without clubbing, cyanosis, or edema.  SKIN:  Without jaundice.  No stigmata of chronic liver disease.  Facial macular rash.  Umbilical piercing.  NEUROLOGIC:  Grossly intact.  Without focal deficit.  LABORATORY DATA:  Abnormal WBC 11,500 with slight left shift, hemoglobin 10.8, hematocrit 31.8, MCV 77.  Urinalysis clear.  Urine pregnancy test negative. CMET normal including LFTs.  Abdominal/pelvic CT:  Films reviewed.  Calcification identified in the region of the common hepatic duct with proximal ductal dilation of the right and left branches.  Gallbladder, distal common bile duct, and pancreas all appear normal.  ASSESSMENT: 1. Abdominal pain:  Possible choledocholithiasis but unusual without findings    of cholelithiasis, no common bile duct dilation distally, normal liver    enzymes, all of which argue against a common hepatic duct obstruction due    to choledocholithiasis.  There is no finding to suggest primary hepatic,    pancreatic, gastric, or enterocolonic pathology.  No evidence for    appendectomy. 2. Leukocytosis:  Mild.  Uncertain etiology.  Transient bacteria suggests     intermittent rigor.  This will need to be closely followed.  For the time    being, we will hold off on the antibiotic therapy with possible     hypersensitivity rash and without further evidence of infectious process. 3. Irritable bowel syndrome:  Constipation predominant. 4. History of chronic nausea and vomiting.  RECOMMENDATIONS: 1. The patient will be admitted for IV hypertension, close observation,    analgesia. 2. HIDA scan:  If positive, consider proceeding to laparoscopic    cholecystectomy with choledocholithiasis removal at the time of surgery.    Alternatively, perform ERCP preoperatively.  If HIDA scan is negative,    MRCP to further evaluate abnormal CT findings. 3. Laboratory, amylase, lipase, repeat CBC, HFP. Dictated by:   Griffith Citron, M.D. Attending Physician:  Shelba Flake DD:  02/03/02 TD:  02/03/02 Job: [id number]

## 2011-04-13 NOTE — Letter (Signed)
September 26, 2006    Jeannett Senior A. Evlyn Kanner, M.D.  [address]  [place], [place] [address]   RE:  LARIAH, FLEER  MRN:  [id number]  /  DOB:  [birth year]   Dear Brett Canales:   It was a pleasure to see Lavra Imler today.  She comes in with her mom.   As you know, she is a 25 year old woman with a complex past medical history  that includes:  1. Depression.  2. IBS.  3. GE reflux disease.  4. Recurrent kidney infections.  She was recently hospitalized for reasons that are not entirely clear to me  but in which the issue of adrenal insufficiency was raised.  She apparently  presented with pyelonephritis, had hypotension and ended up being treated  with steroids, was discharged from [facility], went to [facility], where she was  diuresed exhaustively.  She was found there to have hepatosplenomegaly,  which is currently being worked up.   You asked me to see her to answer the question whether she has long QT  syndrome.   She has a longstanding history of recurrent spells and syncope.  These have  been associated with a stereotypical prodrome of feeling hot and dizzy, and  followed by a significant headache, residual orthostatic intolerance, and  nausea.  She has had a number of episodes of frank syncope more recently as  well as presyncope.  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  On one occasion at  [facility], the ambulance was called and she was found to have a significantly  low blood pressure.   She says that she has had a low blood pressure over the years, that she has  had blood pressures recorded in the 60-70/30-40 range.   She also describes rapid heartbeats, in fact, having seen a doctor yesterday  where her heart rate was documented at 150.  She also notes skips in her  heartbeat.   She has a diet that is quite salt-deplete and, by her  urinary history,  volume deplete, that is she has dark, foul-smelling urine.  She has a  history of recurrent UTIs, so I am not quite sure exactly how to interpret  that.  She has, as noted, orthostatic intolerance, shower intolerance, and  heat intolerance.   She drinks a significant amount of caffeine.   Her family history is notable for no sudden death; no syncope.   Her past medical history is also notable for depression and stress (see  below), and GE reflux disease.   SOCIAL HISTORY:  She is currently going through a divorce.  She has 2  children, one of whom has cerebral palsy and a seizure disorder.  She  smokes.  She does not use alcohol or recreational drugs.   She denies prior surgery.   EXAMINATION:  She is a young, Caucasian female, appearing her stated age of  36.  Her blood pressure was 113/71 with a pulse of 84 lying, 112/74 with a pulse  of 93 sitting.  At 0 minutes, it was 112/73 with a pulse of 109.  The blood  pressure remained stable and the heart rate did not increase further.  HEENT:  Demonstrated no icterus or xanthoma.  The neck veins were flat.  Carotids were brisk and full bilaterally without  bruits.  BACK:  Without kyphosis, scoliosis.  LUNGS:  Clear.  Her heart sounds were regular with an early systolic murmur.  ABDOMEN:  Soft with active bowel sounds without midline pulsation.  NEUROLOGIC:  Grossly normal.  SKIN:  Warm and dry.   Electrocardiogram dated today demonstrated sinus rhythm at 91 with intervals  of 0.14/0.09/0.36 with a QTc of 0.45.   Electrocardiogram obtained from [facility] from February 2005 demonstrated a QT  interval of 0.41 with a QTc of 0.47, with which I concur.  I do not have  other electrocardiograms that are concerning.   IMPRESSION:  1. Recurrent syncope, probably dysautonomic.  2. Borderline long QT with a QT score probably of 2.  3. Significant psychosocial stress.  4. Anemia of question cause with hepatosplenomegaly.   5. Question adrenal insufficiency.   Brett Canales, I think Annemarie Whitt's problem is primarily dysautonomia.  As  noted, I think her long QT score is 2 and that would leave her in a low  probability group for long QT syndrome.   From a diagnostic point of view, a tilt-table test would be useful.  From a  therapeutic point of view, I am not sure that it adds a lot.  I have  encouraged her with her mom here today to:  1. Increase her salt intake.  2. To eliminate the diuretics from her diet in the form of caffeine.  3. To increase her overall fluid intake.  4. To seek counseling and/or medical therapy for her anxiety/depression.  5. If medical therapy is necessary for her adrenal insufficiency,      certainly Florinef would be useful as it relates to the above issues.  6. We have given her the web addresses for potsplace.com and NDRS.org,      which are for patient's with dysautonomia.   I have told her that I would talk to you and we could decide how best I  could help with followup.  At this point, I have not set up any followup  with her.   Brett Canales, thanks very much for asking [place] to see her.    Sincerely,     ______________________________  [name], MD, [facility]    SCK/MedQ  DD: 09/26/2006  DT: 09/26/2006  Job #: [id number]

## 2011-04-13 NOTE — Discharge Summary (Signed)
[facility]  Patient:    Meredith Burns, Meredith Burns Visit Number: [id number] MRN: [id number]          Service Type: MED Location: [phone] Attending Physician:  Deneen Harts Dictated by:   Griffith Citron, M.D. Admit Date:  02/02/2002 Disc. Date: 02/05/02   CC:         Dr. Alphonzo Grieve, [facility]   Discharge Summary  DISCHARGE DIAGNOSES: 1. Right upper quadrant pain - unknown etiology. 2. Nausea, vomiting. 3. Possible biliary ductal dilation. 4. Facial rash. 5. Anemia. 6. Transient leukocytosis, low grade fever.  HOSPITAL COURSE:  The patient was admitted four days ago after she presented to the [facility] emergency room with several hours of right abdominal pain. Her current illness actually began five days before admission when she was seen in the [facility] emergency room.  Evaluation by the ER staff indicated a normal physical exam, CBC, CMET, and abdominal/pelvic CT.  She was discharged home on analgesics.  Because of persistent and worsening symptoms, she represented to the [facility] emergency room.  An abdominal/pelvic CT was obtained.  This revealed a calcification in the region of the common hepatic duct thought to be possible choledocholithiasis with a proximal ductal dilation of the right and left hepatic branches and very early intrahepatic ductal dilation.  On physical exam, she was found to have impressive right upper quadrant tenderness though no rebound or guarding, no organomegaly, and bowel sounds were noted to be active.  A HIDA scan was obtained which was normal which indicated that there was at least partial biliary flow.  The following day, MRCP was normal.  At the time of admission, the patient was also found to be afebrile but had a mild leukocytosis of 11,500.  The patients pain subsided somewhat and attempts to advance her diet yesterday were made.  This was unsuccessful with nausea, vomiting  occurring throughout the afternoon after a clear liquid lunch.  Because of persistent symptoms, IV was restarted with analgesics and antiemetics administered.  This morning, an endoscopy was performed which revealed mild acute gastritis characterized by fundal mucosal hyperemia though without additional lesion being identified.  Follow-up lab work has revealed a resolution of the leukocytosis.  Liver enzymes, amylase, and lipase have repeatedly been entirely normal.  The patient has had a low-grade hypochromic microcytic anemia though to most likely be on the basis of iron deficiency due to menses.  Iron studies are pending.  A Fleets enema was administered yesterday.  Because of the patients history of irritable bowel syndrome - constipation predominant, administration of oral contrast for the CT which had yet to pass, and to cleanse the bowel to see if this would offer some symptomatic improvement.  Although defecation did result, patients symptoms have remained unchanged.  The other consistent finding has been a maculopapular facial rash which extends to the anterior thorax.  This has been evanescent in its intensity. The patient and her mother both feel that this is a new development since her admission.  At the time of discharge, the etiology of the patients symptom complex is unknown.  I do not think that there is a primary GI pathology but, rather, an inflammatory or infectious process.  Fitz-Hugh-Curtis syndrome needs to be considered.  The patient is being transferred to her primary pediatric gastroenterologist, Dr. Alphonzo Grieve, after I discussed the case with him by phone. Whether repeat CT, ERCP, laparoscopy, or additional consultation with infectious disease, dermatology will be needed  will be left to the discretion of Dr. Alphonzo Grieve.  The patient is being transferred because of the advanced diagnostic capabilities at [facility], the patients familiarity with her  longstanding primary gastroenterologist, and to seek additional consultations as outlined. The decision to transfer the patient was made in concert with the patient, her mother, and Dr. Alphonzo Grieve all of whom are in agreement with this action.  RECOMMENDATIONS: 1. Transfer to Dr. Alphonzo Grieve, [facility]. 2. Copy of the patients chart, this stat discharge summary, and all x-ray    films performed over the past week to accompany the patient. Dictated by:   Griffith Citron, M.D. Attending Physician:  Deneen Harts DD:  02/05/02 TD:  02/05/02 Job: [id number]

## 2011-04-13 NOTE — H&P (Signed)
NAME:  Meredith Burns, MASSIMO             ACCOUNT NO.:  [id number]   MEDICAL RECORD NO.:  [id number]          PATIENT TYPE:  INP   LOCATION:  NA                            FACILITY:  [facility]   PHYSICIAN:  Huel Cote, M.D. DATE OF BIRTH:  [birth year]   DATE OF ADMISSION:  DATE OF DISCHARGE:                                HISTORY & PHYSICAL   DATE OF SURGERY:  At [facility] on Apr 05, 2005 at 8 a.m.   HISTORY OF PRESENT ILLNESS:  The patient is a 25 year old G2 P1-0-0-1 who is  admitted at 38-and-a-half weeks gestation for scheduled repeat cesarean  section. The patient had a prior cesarean section for arrest of dilation and  did not desire a trial of labor with this pregnancy. Her prenatal care has  been complicated only by some anemia which has responded slightly to Niferex  with her last hemoglobin on Apr 03, 2005 at 9.5. Her dating was performed by  a 10-week ultrasound, and other than her previous C-section as stated she  has had an uneventful pregnancy.   PRENATAL LABORATORY DATA:  AB positive, antibody negative. RPR nonreactive.  Rubella immune. Hepatitis B surface antigen negative. HIV nonreactive. GC  negative, chlamydia negative. CF negative. Triple screen negative. One-hour  Glucola was 94.   PAST OBSTETRICAL HISTORY:  Significant for her previous cesarean section for  arrest of descent. This was in April 2004. The patient had a female infant, 8  pounds 2 ounces.   PAST GYNECOLOGICAL HISTORY:  She had a history of LEEP performed in 2004.  Her last Pap smear in November 2005 showed low-grade changes. She had a  colposcopy performed which was consistent with low-grade changes and  therefore was to follow up with a Pap smear postpartum.   PAST SURGICAL HISTORY:  Significant for her cesarean section and in 2001 she  had right leg cosmetic surgery performed, and also her LEEP.   PAST MEDICAL HISTORY:  The patient has history of iron deficiency anemia as  stated. Also, history  of ADHD and possible irritable bowel syndrome and  gastritis.   CURRENT MEDICATIONS:  Include Zofran and Phenergan p.r.n. for some nausea  and vomiting intermittently she has had throughout the pregnancy. Also, she  has taken Darvocet and Ambien p.r.n. for some ongoing problems with back  pain.   ALLERGIES:  Include CECLOR, LATEX, and NASAL SPRAY.   PHYSICAL EXAMINATION:  VITAL SIGNS:  The patient is 162 pounds. Blood  pressure is 90/70.  CARDIAC:  Regular rate and rhythm.  LUNGS:  Clear.  ABDOMEN:  Soft, gravid, and nontender. Fundal height is 36.  PELVIC:  Cervix is long and closed.   The patient was counseled on the risks and benefits of proceeding with  cesarean section as a repeat versus a trial of labor. She understands the  risks of bleeding and infection and possible damage to bowel and bladder  that is possible with a surgery. She also understands the risk of slightly  more blood loss with a cesarean section over a vaginal delivery, and desires  to proceed with a repeat cesarean section.  KR/MEDQ  D:  04/04/2005  T:  04/04/2005  Job:  [id number]

## 2011-04-13 NOTE — Assessment & Plan Note (Signed)
[facility]                         GASTROENTEROLOGY OFFICE NOTE   TAYGAN, CONNELL                    MRN:          [id number]  DATE:03/06/2007                            DOB:          [birth year]    OFFICE CONSULTATION NOTE   REFERRING PHYSICIAN:  Jeannett Senior A. Evlyn Kanner, M.D.   REASON FOR CONSULTATION:  Abdominal pain and weight gain.   HISTORY:  This is a 25 year old white female with presumed irritable  bowel syndrome, chronic constipation, recurrent syncope on the basis of  dysautonomia, borderline long QT syndrome, and anxiety with depression.  Her medical history over the past 6 months has been quite complicated.  I have reviewed her hospitalizations and emergency room visits from [facility], as well as [facility].  In brief, the patient was  admitted to [facility] on August 19, 2006 through August 28, 2006  when she presented with fever and hypotension.  She was felt to have  sepsis (presumably on the basis of pyelonephritis) and concurrent  adrenal insufficiency.  She was subsequently hospitalized at [facility] August 29, 2006 through September 04, 2006.  She was noted to  have an elevated lactic acid level of uncertain etiology.  This  resolved.  Also, iron deficiency anemia was found. A Question of  hepatosplenomegaly on CT scan there was raised and investigated (though  CT scan from the month previous from [facility] was read as normal).  She was constipated on the base of narcotics.  Adrenal insufficiency  seemed to resolve and replacement steroids were discontinued.  She did  have followup with Dr. Evlyn Kanner who felt that her adrenal problems were  transient.  She is now on Florinef for syncope.  She reports to me a 30-  pound weight gain over the past 3 months.  She describes severe nausea  once or twice per week with greasy foods.  She is disconcerted by her  weight gain and will self-induce vomiting for relief.   She reports  abdominal pain and constipation which she has had for many years, which  is really unchanged.  The abdominal pain is generally on the left side  and is described as sharp and intermittent.  Also associated bloating.  She has been diagnosed with irritable bowel syndrome though has had no  formal investigations or work up.  Her father, at age 35, was diagnosed  last week with colon cancer.  Review of old records shows that her  weight October 14, 2006 was 148 pounds.  Her weight today is 161  pounds.  The patient denies pyrosis, dysphagia, odynophagia, melena, or  hematochezia.   PAST SURGICAL HISTORY:  C-section x2.   ALLERGIES:  She is intolerant to CECLOR, as well as unspecified nasal  spray.   MEDICATIONS:  1. Florinef 0.1 mg p.o. b.i.d.  2. Potassium chloride 20 mEq p.o. b.i.d.  3. Prozac 20 mg p.o. daily.   FAMILY HISTORY:  Father with colon cancer at age 87.  Also family  history of diabetes and breast cancer.   SOCIAL HISTORY:  The patient is divorced with 2 children, 1 of  them has  cerebral palsy.  She lives with her mother.  She attended school through  the 12th grade.  She is currently unemployed.  She smokes 1 pack of  cigarettes per day.  She denies alcohol use.  She denies recreational  drug use.   REVIEW OF SYSTEMS:  Extensive.  See diagnostic evaluation form.   PHYSICAL EXAM:  Somewhat anxious, in no acute distress.  Blood pressure is 92/58, heart rate is 88 and regular, weight is 161.8  pounds.  She is 5 feet 6 inches in height.  HEENT:  Sclerae anicteric.  Conjunctivae pink.  Oral mucosa is intact.  Her tongue is pierced with a tongue ring in place.  LUNGS:  Clear to auscultation and percussion.  HEART:  Regular.  ABDOMEN:  Soft without tenderness, mass, or hernia.  No obvious  organomegaly.  Good bowel sounds heard.  The umbilicus is also pierced.  EXTREMITIES:  Reveal no edema.  NEUROLOGIC:  She is grossly intact without focal deficit.  No  asterixis.   IMPRESSION:  1. Chronic intermittent abdominal pain, likely on the basis of      constipation predominant irritable bowel syndrome.  2. Weight gain.  Unlikely pathologic, but rather the result of several      months of wellness compared with her difficulties last fall.  3. Anxiety with depression.  Also,  question possible eating disorder      with history of self-induced vomiting.  4. History of recurrent syncope, felt to be on the basis of      dysautonomia.  5. Family history of colon cancer.   RECOMMENDATIONS:  1. Obtain an abdominal ultrasound to specifically evaluate the      gallbladder as a possible culprit for intermittent abdominal      discomfort, nausea and vomiting. Specifically, to rule out      gallbladder disease.  2. Schedule colonoscopy and upper endoscopy to evaluate abdominal      pain, nausea, and vomiting, as well as iron deficiency anemia.      Also, to provide neoplasia screening in a patient with a family      history of premature colon cancer.  3. MiraLax for constipation 17 g daily in 8 to 10 ounces of water.      This may be titrated to need.     [name], MD  Electronically Signed    JNP/MedQ  DD: 03/11/2007  DT: 03/11/2007  Job #: [id number]   cc:   Jeannett Senior A. Evlyn Kanner, M.D.  [name], MD, [facility]

## 2011-04-13 NOTE — Discharge Summary (Signed)
Meredith Burns, BRENTON             ACCOUNT NO.:  [id number]   MEDICAL RECORD NO.:  [id number]          PATIENT TYPE:  INP   LOCATION:  9148                          FACILITY:  [facility]   PHYSICIAN:  Malachi Pro. Ambrose Mantle, M.D. DATE OF BIRTH:  22-Sep-1986   DATE OF ADMISSION:  04/05/2005  DATE OF DISCHARGE:  04/08/2005                                 DISCHARGE SUMMARY   The patient is AB positive with a negative antibody, RPR nonreactive,  rubella immune, hepatitis B surface antigen negative, HIV nonreactive, GC  and Chlamydia negative.  Cystic fibrosis negative, triple screen negative, 1-  hour Glucola 94.  The patient's prenatal course is detailed in her history  and physical.   HOSPITAL COURSE:  She was admitted to the hospital on May 11, for a repeat  cesarean section and underwent a low transverse cesarean section by Huel Cote, M.D. with Dr. Ambrose Mantle assisting under spinal anesthesia.  Estimated blood loss about 800 mL.  Delivery was of a vigorous female  infant, 7 pounds 13 ounces, Apgars of 8 at one minute and 9 at five minutes.  Normal uterus, tubes, and ovaries.  Postpartum, the patient did extremely  well.  Blood count started out low at 9.0, it was 7.3 postoperatively, and  other than a little bit of difficulty with pain control which required  switching from Percocet to Demerol, the patient did extremely well.  Her  staples were removed.  Strips were applied on the third postoperative day  and she was discharged.   FINAL DIAGNOSES:  1.  Intrauterine pregnancy at 38+ weeks with prior cesarean section,      declined vaginal birth after cesarean section.  2.  Anemia.   PROCEDURE:  Low transverse cesarean section.   LABORATORY DATA:  Initial hemoglobin 9.0, hematocrit 27.0, white count 9800,  platelet count 199,000.  Follow-up hemoglobin 7.3, hematocrit 21.8, platelet  count 152,000.  RPR nonreactive.   CONDITION ON DISCHARGE:  Improved.   DISCHARGE INSTRUCTIONS:  Regular  discharge instruction booklet.  The patient  is advised to watch for any signs of fever above 100 degrees, call with any  unusual problems, avoid heavy lifting or strenuous activity, and return to  the office in 10 to 14 days for follow-up examination.   DISCHARGE MEDICATIONS:  1.  Demerol 50 mg 24 tablets one every 4 to 6 hours as needed for pain.  2.  Motrin 600 mg 30 one every 6 hours as needed for pain with one refill.      TFH/MEDQ  D:  04/08/2005  T:  04/09/2005  Job:  [id number]

## 2011-04-13 NOTE — Op Note (Signed)
NAMEAMALYA, Meredith Burns             ACCOUNT NO.:  [id number]   MEDICAL RECORD NO.:  [id number]          PATIENT TYPE:  INP   LOCATION:  9148                          FACILITY:  [facility]   PHYSICIAN:  Huel Cote, M.D. DATE OF BIRTH:  December 01, 1985   DATE OF PROCEDURE:  04/05/2005  DATE OF DISCHARGE:                                 OPERATIVE REPORT   PREOPERATIVE DIAGNOSES:  1.  Previous cesarean section, desires repeat.  2.  Term pregnancy at 38+ weeks.   POSTOPERATIVE DIAGNOSES:  1.  Previous cesarean section, desires repeat.  2.  Term pregnancy at 38+ weeks.   PROCEDURE:  Repeat low transverse cesarean section.   SURGEON:  Dr. Huel Cote.   ASSISTANT:  Dr. Tracey Harries.   ANESTHESIA:  Spinal.   FINDINGS:  There was a vigorous female infant in vertex presentation.  Apgars were 8 and 9, weight was 7 pounds 13 ounces.  There were normal  uterus, tubes and ovaries noted.   FLUIDS:  Estimated blood loss was 100 mL, urine output 300 mL, and IV fluids  3500 mL.   PROCEDURE:  The patient was taken to the operating room, where spinal  anesthesia was obtained without difficulty.  She was then prepped and draped in the normal sterile fashion in dorsal  supine position with a leftward tilt.  A Pfannenstiel skin incision was then  made through her preexisting scar and carried through to the underlying  layer of fascia by sharp dissection and Bovie cautery.  Fascia was then  nicked in the midline and the incision was extended laterally with Mayo  scissors.  The inferior aspect was grasped with Kocher clamps, elevated and  dissected off the underlying rectus muscles.  In a similar fashion, the  superior aspect was dissected off the rectus muscles.  The rectus muscles  were separated in the midline and the peritoneal cavity entered bluntly.  The peritoneal incision was then extended both superiorly and inferiorly  with careful attention to avoid both bowel and bladder.  The bladder  blade  was then inserted, vesicouterine peritoneum identified and the bladder flap  created with Metzenbaum scissors.  This was then pushed well away from the  lower uterine segment.  The lower uterine segment was then incised in a  transverse fashion and membranes ruptured with clear fluid noted.  The  infant's head was then delivered with the assistance of a Kiwi vacuum given  that it was in a slightly deflexed position and was somewhat floating.  With  this assistance, the head was easily delivered atraumatically and the infant  was bulb-suctioned with the remainder of the body delivered uneventfully.  The cord was clamped and cut and infant handed off to the awaiting  pediatricians.  Cord blood was obtained and the placenta then delivered  manually.  The uterus was cleared of all clots and debris with a moist lap  sponge.  The uterine incision was then closed with 0 chromic in a running  locked fashion.  A second layer of the same suture was then placed with good  hemostasis noted.  One small area  of bleeding at the right angle was  controlled with a figure-of-eight suture of 0 Vicryl and Bovie cautery.  The  subfascial planes were  inspected and found to be hemostatic.  The rectus muscles were then  reapproximated with several interrupted sutures of 0 Vicryl.  The fascia was  then closed with 0 Vicryl in a running fashion and the skin was closed with  staples.  Sponge, lap and needle counts were correct x2, and the patient was  taken to the recovery room in stable condition.      KR/MEDQ  D:  04/05/2005  T:  04/05/2005  Job:  [id number]

## 2011-04-13 NOTE — Discharge Summary (Signed)
NAME:  Meredith Burns, Meredith Burns                      ACCOUNT NO.:  [id number]   MEDICAL RECORD NO.:  [id number]                   PATIENT TYPE:  INP   LOCATION:  9106                                 FACILITY:  [facility]   PHYSICIAN:  Huel Cote, M.D.              DATE OF BIRTH:  02-Jun-1986   DATE OF ADMISSION:  03/22/2003  DATE OF DISCHARGE:  03/25/2003                                 DISCHARGE SUMMARY   DISCHARGE DIAGNOSES:  1. Term pregnancy at 40 and one-seventh weeks, delivered.  2. Teenage pregnancy.  3. Status post failed induction.  4. Primary low transverse cesarean section for arrest of descent.   DISCHARGE MEDICATIONS:  1. Motrin 600 mg p.o. q.6h. p.r.n.  2. Percocet one to two tablets p.o. q.4 p.r.n.   DISCHARGE FOLLOW-UP:  The patient is to follow up in two weeks for an  incision check and again in six weeks for her full postpartum exam.   HISTORY OF PRESENT ILLNESS:  The patient is an 25 year old G1 P0 at 60 and  one-seventh weeks who presented for induction given that she had a favorable  cervix and was status post her due date.  Prenatal care had been complicated  by some question of a long QT syndrome and syncopal episodes; however, these  were all cleared by cardiology and symptoms were probably anxiety related.  The patient also had a low-grade SIL Pap smear noted with a negative  colposcopy; this is to be repeated postpartum.  She had some sodium and  vomiting in the first and second trimester which responded to Phenergan and  Reglan.   PRENATAL LABORATORY DATA:  AB positive, antibody negative.  RPR nonreactive.  Rubella immune.  Hepatitis B surface antigen negative.  HIV negative.  GC  negative, chlamydia negative.  GBS negative.   PAST OBSTETRICAL HISTORY:  None.   PAST GYNECOLOGICAL HISTORY:  Low-grade SIL Pap smear as above.   PAST SURGICAL HISTORY:  Right leg - she had some cosmetic surgery done.   PAST MEDICAL HISTORY:  1. History of ADHD.  2.  History of anxiety.  3. Irritable bowel syndrome.   ALLERGIES:  CECLOR and NASAL SPRAYS.   HOSPITAL COURSE:  The patient was afebrile with stable vital signs on  admission.  Fetal heart rate was reactive.  She was gravid and nontender.  The cervix was 50%, 2+, and a -2 station.  She had rupture of membranes  performed and was placed on IV Pitocin.  She did well and progressed to 10  cm by the eveningtime.  Once she reached complete dilation the position of  the baby was noted to be ROP and she was rested in the exaggerated Sims  position for approximately an hour before pushing.  She then began pushing  and pushed for approximately an hour-and-a-half with minimal progression of  station beyond 0; position remained ROP.  The patient became very tearful  and stated that  she could not push any longer secondary to back pain.  Therefore, it was discussed with the patient and her family to proceed with  a delivery by C section.  She underwent a low transverse cesarean section,  was delivered of a vigorous female infant.  Apgars were 6 and 8.  Weight was 8  pounds 2 ounces.  She did require general anesthesia; she was not adequately  comfortable with her epidural.  Otherwise, surgery was without complication.  On postoperative day #3 she was doing very well, tolerating a regular diet.  Her pain was controlled with Percocet, she was ambulating, and the decision  was made that she was stable for discharge.  She was therefore discharged to  home after her staples had been removed and Steri-Strips placed, with Motrin  and Percocet prescriptions.                                               Huel Cote, M.D.    KR/MEDQ  D:  03/25/2003  T:  03/25/2003  Job:  [id number]

## 2011-04-13 NOTE — Assessment & Plan Note (Signed)
[facility]                         ELECTROPHYSIOLOGY OFFICE NOTE   Meredith, Burns                    MRN:          [id number]  DATE:11/08/2006                            DOB:          [birth year]    [name] is seen. She has had another episode of syncope or  lightheadedness and remains orthostatic and accompanied by typical  epiphenomena.   She was started on Florinef by Tereso Newcomer in our office 2 weeks ago.  This has been associated with puffiness in her hands and nausea. She was  also started on potassium as her potassium was 3.1. This has also been  associated with nausea.   She is increasing her salt intake. She is increasing her fluid intake.  She remains at home with her 2 children, her daughter who is severely  disabled with cerebral palsy and seizures.   On examination today, her blood pressure was 108/78, pulse was 80.  LUNGS:  Were clear.  HEART:  Sounds were regular.   IMPRESSION:  1. Dysautonomic syncope.  2. Borderline QT.  3. Significant psychosocial stress.  4. Anemia with question hepatosplenomegaly.   There are a number of issues that remain unanswered here:  1. Relates to her anemia clearly.  2. The issue of her low potassium.   In relation to other issues, given her problems with the Florinef, I am  going to discontinue it and put her on ProAmatine at 2.5 mg 3 times a  day. I have advised her to take it every 4 or so hours while she is  awake and have it be out of her system by evening time.   We will also decrease her potassium to 10 mEq q day.   I will talk with Dr. Foy Guadalajara up at [facility] as to whether he is able to  become a primary care [name] for her. We have started her on Zoloft  today for her anxiety/stress. This was done at 25 mg a day.     [name], MD, [facility]  Electronically Signed   SCK/MedQ  DD: 11/08/2006  DT: 11/08/2006  Job #: [id number]   cc:   Tera Mater. Evlyn Kanner, M.D.

## 2011-04-13 NOTE — Op Note (Signed)
NAME:  Meredith Burns, Meredith Burns                      ACCOUNT NO.:  [id number]   MEDICAL RECORD NO.:  [id number]                   PATIENT TYPE:  INP   LOCATION:  9106                                 FACILITY:  [facility]   PHYSICIAN:  Huel Cote, M.D.              DATE OF BIRTH:  04-07-1986   DATE OF PROCEDURE:  03/22/2003  DATE OF DISCHARGE:                                 OPERATIVE REPORT   PREOPERATIVE DIAGNOSES:  1. Term pregnancy at 40+ weeks.  2. Arrest of descent.  3. Occiput posterior presentation.   POSTOPERATIVE DIAGNOSES:  1. Term pregnancy at 40+ weeks.  2. Arrest of descent.  3. Occiput posterior presentation.   PROCEDURE:  Primary low transverse cesarean section.   SURGEON:  Huel Cote, M.D.   ANESTHESIA:  Epidural and general anesthesia after epidural was not adequate  on the right side.   FINDINGS:  Vigorous female infant in the vertex presentation.  Apgars were 6  and 8.  Weight was 8 pounds 2 ounces.   ESTIMATED BLOOD LOSS:  600 mL.   URINE OUTPUT:  425 mL slightly blood tinged urine.   IV FLUIDS:  1600.   PROCEDURE:  The patient was taken to the operating room where epidural  anesthesia was initially found to be adequate.  However, upon the incision  patient had some discomfort on her right side which could not be relieved  with redosing.  Therefore, decision was made to proceed with general  anesthesia for patient comfort.  The patient was prepped and draped in a  normal sterile fashion in the dorsal supine position with a leftward tilt.  A Pfannenstiel skin incision was then made approximately 2 cm above the  pubis and carried through the underlying layer of fascia with sharp  dissection and Bovie cautery.  The fascia was then nicked in the midline and  the incision was extended laterally with Mayo scissors.  The inferior aspect  of the incision was grasped with Kocher clamps, elevated, and dissected off  the underlying rectus muscles.  In a similar  fashion the superior aspect was  grasped with Kocher clamps and elevated off the rectus muscles.  The rectus  muscles were separated in the midline and the peritoneum identified, grasped  with hemostats, and entered bluntly.  The peritoneal incision was then  extended both superiorly and inferiorly with careful attention to avoid both  bowel and bladder.  The bladder blade was then inserted and the  vesicouterine peritoneum identified and the bladder flap created with  Metzenbaum scissors.  The uterine segment was then exposed and a transverse  incision made in the lower uterine segment with a scalpel.  The cavity  itself was entered bluntly.  This incision was then extended digitally.  The  infant's head was then delivered atraumatically and bulb suctioned.  The  remainder of the body was delivered without difficulty.  The cord was  clamped and handed  off to the waiting pediatrician.  After cord blood was  obtained the placenta was then delivered manually and the uterus cleared of  all clots and debris with a moist lap sponge.  The uterus was noted to  contract nicely and the uterine incision was then closed with 1-0 chromic in  a running locked fashion.  The gutters were cleared of all clots and debris  and the ovaries and tubes inspected and found to be normal.  There were no  other areas of bleeding noted and the incision was once again inspected and  found to be hemostatic.  Therefore, all sutures and instruments were removed  from the abdomen and the fascia was closed with 0 Vicryl in a running  fashion.  The subcuticular tissue was reapproximated with 0 chromic and the  skin was closed with staples.  Sponge, lap, and needle counts were correct  x2 and the patient was taken to the recovery room in stable condition.                                               Huel Cote, M.D.    KR/MEDQ  D:  03/22/2003  T:  03/23/2003  Job:  [id number]

## 2011-06-03 ENCOUNTER — Emergency Department (INDEPENDENT_AMBULATORY_CARE_PROVIDER_SITE_OTHER): Payer: Self-pay

## 2011-06-03 ENCOUNTER — Encounter: Payer: Self-pay | Admitting: *Deleted

## 2011-06-03 ENCOUNTER — Emergency Department (HOSPITAL_BASED_OUTPATIENT_CLINIC_OR_DEPARTMENT_OTHER)
Admission: EM | Admit: 2011-06-03 | Discharge: 2011-06-03 | Disposition: A | Discharge status: Home / Self Care | Payer: Self-pay | Attending: Emergency Medicine | Admitting: Emergency Medicine

## 2011-06-03 DIAGNOSIS — S93409A Sprain of unspecified ligament of unspecified ankle, initial encounter: Secondary | ICD-10-CM | POA: Insufficient documentation

## 2011-06-03 DIAGNOSIS — W108XXA Fall (on) (from) other stairs and steps, initial encounter: Secondary | ICD-10-CM | POA: Insufficient documentation

## 2011-06-03 DIAGNOSIS — M25579 Pain in unspecified ankle and joints of unspecified foot: Secondary | ICD-10-CM

## 2011-06-03 DIAGNOSIS — S90569A Insect bite (nonvenomous), unspecified ankle, initial encounter: Secondary | ICD-10-CM | POA: Insufficient documentation

## 2011-06-03 HISTORY — DX: Hypokalemia: E87.6

## 2011-06-03 LAB — PREGNANCY, URINE: Preg Test, Ur: NEGATIVE

## 2011-06-03 MED ORDER — HYDROCODONE-ACETAMINOPHEN 5-325 MG PO TABS: 1.0000 | ORAL_TABLET | Freq: Four times a day (QID) | ORAL | Status: AC | PRN

## 2011-06-03 MED ORDER — MUPIROCIN CALCIUM 2 % EX CREA
TOPICAL_CREAM | Freq: Two times a day (BID) | CUTANEOUS | Status: DC
  Administered 2011-06-03: 04:00:00 via TOPICAL

## 2011-06-03 MED ORDER — MUPIROCIN CALCIUM 2 % EX CREA
TOPICAL_CREAM | CUTANEOUS | Status: AC
  Filled 2011-06-03: qty 15

## 2011-06-03 NOTE — ED Provider Notes (Signed)
History     Chief Complaint  Patient presents with  . Fall  . Insect Bite   Patient is a 25 y.o. female presenting with fall. The history is provided by the patient.  Fall The accident occurred yesterday. Incident: on the stairs. There was no blood loss. Pain location: left ankle. The pain is moderate. She was ambulatory at the scene.    Past Medical History  Diagnosis Date  . Hypokalemia     Past Surgical History  Procedure Date  . Cholecystectomy     History reviewed. No pertinent family history.  History  Substance Use Topics  . Smoking status: Current Everyday Smoker  . Smokeless tobacco: Not on file  . Alcohol Use: No    OB History    Grav Para Term Preterm Abortions TAB SAB Ect Mult Living                  Review of Systems  Gastrointestinal:       Postprandial emesis for several weeks; no associated abdominal pain  Skin:       Insect bite or sting to anterior left ankle yesterday  All other systems reviewed and are negative.    Physical Exam  BP 91/50  Pulse 60  Temp(Src) 98.5 F (36.9 C) (Oral)  Resp 18  Ht 5\' 5"  (1.651 m)  Wt 133 lb (60.328 kg)  BMI 22.13 kg/m2  SpO2 100%  Physical Exam  Constitutional: She appears well-developed and well-nourished.  HENT:  Head: Normocephalic and atraumatic.  Eyes: Conjunctivae and EOM are normal. Pupils are equal, round, and reactive to light.  Neck: Normal range of motion. Neck supple.  Cardiovascular: Normal rate and regular rhythm.   Pulmonary/Chest: Effort normal and breath sounds normal.  Abdominal: Soft. She exhibits no distension.  Musculoskeletal:       Left ankle: She exhibits swelling. She exhibits normal range of motion, no deformity and normal pulse. tenderness. Lateral malleolus tenderness found.       Feet:  Neurological: She is alert. Coordination normal.  Skin: Skin is warm and dry.    ED Course  Procedures  MDM Will refer to GI for emesis.       Hanley Seamen,  MD 06/03/11 [phone]

## 2011-06-03 NOTE — ED Notes (Signed)
Pt has rested quietly and verbalized no complaints.

## 2011-06-03 NOTE — ED Notes (Signed)
MD at bedside. 

## 2011-06-03 NOTE — ED Notes (Signed)
C/o ? Bite to left lower leg. Pt also has been vomiting for several weeks anytime she eats. Fell down steps tonight and injured left foot.

## 2011-08-20 LAB — URINALYSIS, ROUTINE W REFLEX MICROSCOPIC
Leukocytes, UA: NEGATIVE
Nitrite: NEGATIVE
Protein, ur: NEGATIVE
Specific Gravity, Urine: 1.025
Urobilinogen, UA: 0.2

## 2011-08-20 LAB — HCG, QUANTITATIVE, PREGNANCY: hCG, Beta Chain, Quant, S: 20993 — ABNORMAL HIGH

## 2011-08-20 LAB — URINE MICROSCOPIC-ADD ON

## 2011-08-21 LAB — COMPREHENSIVE METABOLIC PANEL
Albumin: 3.9
Alkaline Phosphatase: 31 — ABNORMAL LOW
BUN: 8
Creatinine, Ser: 0.58
Glucose, Bld: 92
Potassium: 3.6
Total Bilirubin: 0.7
Total Protein: 6.5

## 2011-08-21 LAB — CBC
HCT: 34.2 — ABNORMAL LOW
Hemoglobin: 11.9 — ABNORMAL LOW
MCV: 83
Platelets: 192
RDW: 13.5

## 2011-08-21 LAB — URINALYSIS, ROUTINE W REFLEX MICROSCOPIC
Protein, ur: NEGATIVE
Specific Gravity, Urine: >1.03 — ABNORMAL HIGH
Urobilinogen, UA: 0.2

## 2011-08-22 LAB — URINALYSIS, ROUTINE W REFLEX MICROSCOPIC
Bilirubin Urine: NEGATIVE
Ketones, ur: NEGATIVE
Protein, ur: NEGATIVE
Specific Gravity, Urine: >1.03 — ABNORMAL HIGH
Urobilinogen, UA: 0.2

## 2011-08-22 LAB — COMPREHENSIVE METABOLIC PANEL
ALT: 13
AST: 14
Albumin: 3.7
Alkaline Phosphatase: 35 — ABNORMAL LOW
CO2: 22
Chloride: 104
GFR calc Af Amer: >60
Potassium: 3.2 — ABNORMAL LOW
Sodium: 133 — ABNORMAL LOW
Total Bilirubin: 0.7

## 2011-08-22 LAB — URINE MICROSCOPIC-ADD ON

## 2011-08-22 LAB — CBC
Platelets: 188
RBC: 3.99
WBC: 7.1

## 2011-08-23 LAB — POCT URINALYSIS DIP (DEVICE)
Hgb urine dipstick: NEGATIVE
Nitrite: NEGATIVE
Protein, ur: 30 — AB
pH: 7

## 2011-08-23 LAB — URINALYSIS, ROUTINE W REFLEX MICROSCOPIC
Bilirubin Urine: NEGATIVE
Glucose, UA: NEGATIVE
Hgb urine dipstick: NEGATIVE
Ketones, ur: 40 — AB
Leukocytes, UA: NEGATIVE
Protein, ur: NEGATIVE
Specific Gravity, Urine: <1.005 — ABNORMAL LOW
Specific Gravity, Urine: 1.013
pH: 6.5

## 2011-08-23 LAB — URINE MICROSCOPIC-ADD ON

## 2011-08-23 LAB — POCT RAPID STREP A: Streptococcus, Group A Screen (Direct): NEGATIVE

## 2011-08-23 LAB — WET PREP, GENITAL: Trich, Wet Prep: NONE SEEN

## 2011-08-27 LAB — WET PREP, GENITAL
Trich, Wet Prep: NONE SEEN
Yeast Wet Prep HPF POC: NONE SEEN

## 2011-08-27 LAB — COMPREHENSIVE METABOLIC PANEL
ALT: 11
AST: 16
Albumin: 2.7 — ABNORMAL LOW
Calcium: 8.4
GFR calc Af Amer: >60
Glucose, Bld: 93
Sodium: 135
Total Protein: 5.7 — ABNORMAL LOW

## 2011-08-27 LAB — URINALYSIS, ROUTINE W REFLEX MICROSCOPIC
Bilirubin Urine: NEGATIVE
Glucose, UA: NEGATIVE
Glucose, UA: NEGATIVE
Hgb urine dipstick: NEGATIVE
Nitrite: NEGATIVE
Specific Gravity, Urine: <1.005 — ABNORMAL LOW
pH: 5.5
pH: 6

## 2011-08-27 LAB — CBC
MCHC: 33.8
Platelets: 182
RDW: 13.2

## 2011-08-27 LAB — LIPASE, BLOOD: Lipase: 21

## 2011-08-27 LAB — AMYLASE: Amylase: 62

## 2011-08-28 LAB — CBC
HCT: 29.2 — ABNORMAL LOW
Hemoglobin: 9.6 — ABNORMAL LOW
MCHC: 33.3
MCV: 78.2
MCV: 78.4
Platelets: 155
RBC: 3.62 — ABNORMAL LOW
RBC: 3.72 — ABNORMAL LOW
WBC: 8.1
WBC: 8.7

## 2011-08-28 LAB — COMPREHENSIVE METABOLIC PANEL
Alkaline Phosphatase: 183 — ABNORMAL HIGH
BUN: 4 — ABNORMAL LOW
Chloride: 104
Glucose, Bld: 87
Potassium: 3.9
Total Bilirubin: 0.2 — ABNORMAL LOW

## 2011-08-28 LAB — URINALYSIS, ROUTINE W REFLEX MICROSCOPIC
Glucose, UA: NEGATIVE
Hgb urine dipstick: NEGATIVE
Specific Gravity, Urine: >1.03 — ABNORMAL HIGH

## 2011-08-28 LAB — CCBB MATERNAL DONOR DRAW

## 2011-08-30 ENCOUNTER — Encounter (HOSPITAL_BASED_OUTPATIENT_CLINIC_OR_DEPARTMENT_OTHER): Payer: Self-pay | Admitting: Emergency Medicine

## 2011-08-30 ENCOUNTER — Emergency Department (HOSPITAL_BASED_OUTPATIENT_CLINIC_OR_DEPARTMENT_OTHER)
Admission: EM | Admit: 2011-08-30 | Discharge: 2011-08-31 | Disposition: A | Discharge status: Home / Self Care | Payer: Self-pay | Attending: Emergency Medicine | Admitting: Emergency Medicine

## 2011-08-30 ENCOUNTER — Emergency Department (INDEPENDENT_AMBULATORY_CARE_PROVIDER_SITE_OTHER): Payer: Self-pay

## 2011-08-30 DIAGNOSIS — K5790 Diverticulosis of intestine, part unspecified, without perforation or abscess without bleeding: Secondary | ICD-10-CM

## 2011-08-30 DIAGNOSIS — K5730 Diverticulosis of large intestine without perforation or abscess without bleeding: Secondary | ICD-10-CM | POA: Insufficient documentation

## 2011-08-30 DIAGNOSIS — K589 Irritable bowel syndrome without diarrhea: Secondary | ICD-10-CM | POA: Insufficient documentation

## 2011-08-30 DIAGNOSIS — R1031 Right lower quadrant pain: Secondary | ICD-10-CM | POA: Insufficient documentation

## 2011-08-30 HISTORY — DX: Irritable bowel syndrome, unspecified: K58.9

## 2011-08-30 HISTORY — DX: Orthostatic hypotension: I95.1

## 2011-08-30 HISTORY — DX: Tachycardia, unspecified: R00.0

## 2011-08-30 HISTORY — DX: Pneumonia, unspecified organism: J18.9

## 2011-08-30 HISTORY — DX: Gastritis, unspecified, without bleeding: K29.70

## 2011-08-30 LAB — URINALYSIS, ROUTINE W REFLEX MICROSCOPIC
Ketones, ur: NEGATIVE mg/dL
Nitrite: NEGATIVE
Specific Gravity, Urine: 1.015 (ref 1.005–1.030)
Urobilinogen, UA: 0.2 mg/dL (ref 0.0–1.0)
pH: 8.5 — ABNORMAL HIGH (ref 5.0–8.0)

## 2011-08-30 LAB — DIFFERENTIAL
Basophils Absolute: 0 10*3/uL (ref 0.0–0.1)
Basophils Relative: 1 % (ref 0–1)
Eosinophils Absolute: 0.2 10*3/uL (ref 0.0–0.7)
Monocytes Relative: 6 % (ref 3–12)
Neutro Abs: 2.7 10*3/uL (ref 1.7–7.7)
Neutrophils Relative %: 42 % — ABNORMAL LOW (ref 43–77)

## 2011-08-30 LAB — CBC
MCH: 28.4 pg (ref 26.0–34.0)
MCHC: 33.8 g/dL (ref 30.0–36.0)
Platelets: 213 10*3/uL (ref 150–400)
RDW: 13 % (ref 11.5–15.5)

## 2011-08-30 LAB — URINE MICROSCOPIC-ADD ON

## 2011-08-30 NOTE — ED Notes (Signed)
Pt presents with "bloating with fluid'

## 2011-08-30 NOTE — ED Notes (Signed)
Right sided abd pain with vomiting since this morning.

## 2011-08-31 DIAGNOSIS — R109 Unspecified abdominal pain: Secondary | ICD-10-CM

## 2011-08-31 DIAGNOSIS — K5730 Diverticulosis of large intestine without perforation or abscess without bleeding: Secondary | ICD-10-CM

## 2011-08-31 DIAGNOSIS — R110 Nausea: Secondary | ICD-10-CM

## 2011-08-31 LAB — COMPREHENSIVE METABOLIC PANEL
AST: 13 U/L (ref 0–37)
Albumin: 3.7 g/dL (ref 3.5–5.2)
Alkaline Phosphatase: 44 U/L (ref 39–117)
BUN: 10 mg/dL (ref 6–23)
Potassium: 3.4 mEq/L — ABNORMAL LOW (ref 3.5–5.1)
Sodium: 138 mEq/L (ref 135–145)
Total Protein: 6.8 g/dL (ref 6.0–8.3)

## 2011-08-31 LAB — LIPASE, BLOOD: Lipase: 37 U/L (ref 11–59)

## 2011-08-31 MED ORDER — PROMETHAZINE HCL 25 MG PO TABS: 25.0000 mg | ORAL_TABLET | Freq: Four times a day (QID) | ORAL | Status: AC | PRN

## 2011-08-31 MED ORDER — PROMETHAZINE HCL 25 MG/ML IJ SOLN
25.0000 mg | Freq: Once | INTRAMUSCULAR | Status: AC
  Administered 2011-08-31: 25 mg via INTRAVENOUS
  Filled 2011-08-31: qty 1

## 2011-08-31 MED ORDER — POTASSIUM CHLORIDE 20 MEQ/15ML (10%) PO LIQD
20.0000 meq | Freq: Once | ORAL | Status: AC
  Administered 2011-08-31: 20 meq via ORAL
  Filled 2011-08-31: qty 15

## 2011-08-31 MED ORDER — SODIUM CHLORIDE 0.9 % IV BOLUS (SEPSIS)
1000.0000 mL | Freq: Once | INTRAVENOUS | Status: AC
  Administered 2011-08-31: 1000 mL via INTRAVENOUS

## 2011-08-31 MED ORDER — HYDROMORPHONE HCL 1 MG/ML IJ SOLN
1.0000 mg | Freq: Once | INTRAMUSCULAR | Status: AC
  Administered 2011-08-31: 1 mg via INTRAVENOUS
  Filled 2011-08-31: qty 1

## 2011-08-31 NOTE — ED Provider Notes (Signed)
History     CSN: [id number] Arrival date & time: 08/30/2011 10:02 PM  Chief Complaint  Patient presents with  . Abdominal Pain    (Consider location/radiation/quality/duration/timing/severity/associated sxs/prior treatment) Patient is a 25 y.o. female presenting with abdominal pain. The history is provided by the patient.  Abdominal Pain The primary symptoms of the illness include abdominal pain, nausea and vomiting. The primary symptoms of the illness do not include fever, shortness of breath or dysuria.  Symptoms associated with the illness do not include chills, constipation or back pain.   patient has had on and off abdominal pain chronically and a history of IBS. Tonight she developed severe right-sided abdominal pain. She is remote history of cholecystectomy. No fevers or chills. She has had nausea and vomiting tonight no diarrhea. No blood in stools. She does not have a primary care for a GI doctor. Recently finished a prescription of azithromycin for bronchitis. Her recent travels or camping or well water. He is sharp in nature it is located in the right lower quadrant. No radiation. No associated vaginal bleeding or discharge. Pain has been constant since onset  Past Medical History  Diagnosis Date  . Hypokalemia   . Pneumonia   . Irritable bowel syndrome (IBS)   . Gastritis   . Hypotension, postural   . Tachycardia     Past Surgical History  Procedure Date  . Cholecystectomy     History reviewed. No pertinent family history.  History  Substance Use Topics  . Smoking status: Current Everyday Smoker  . Smokeless tobacco: Not on file  . Alcohol Use: No    OB History    Grav Para Term Preterm Abortions TAB SAB Ect Mult Living                  Review of Systems  Constitutional: Negative for fever and chills.  HENT: Negative for neck pain and neck stiffness.   Eyes: Negative for pain.  Respiratory: Negative for shortness of breath.   Cardiovascular: Negative  for chest pain.  Gastrointestinal: Positive for nausea, vomiting, abdominal pain and abdominal distention. Negative for constipation, blood in stool and anal bleeding.  Genitourinary: Negative for dysuria.  Musculoskeletal: Negative for back pain.  Skin: Negative for rash.  Neurological: Negative for headaches.  All other systems reviewed and are negative.    Allergies  Aspirin; Cefaclor; Chocolate; Nasal spray; Ondansetron hcl; Peanut-containing drug products; Iodinated diagnostic agents; and Latex  Home Medications   Current Outpatient Rx  Name Route Sig Dispense Refill  . ACETAMINOPHEN 500 MG PO TABS Oral Take 1,000 mg by mouth once as needed. For pain     . AMPHETAMINE-DEXTROAMPHETAMINE 20 MG PO TABS Oral Take 20 mg by mouth 4 (four) times daily.      Marland Kitchen BENZONATATE 200 MG PO CAPS Oral Take 200 mg by mouth 3 (three) times daily as needed. For cough     . RANITIDINE HCL 150 MG PO TABS Oral Take 150 mg by mouth once as needed. For indigestion     . ALBUTEROL 90 MCG/ACT IN AERS Inhalation Inhale 2 puffs into the lungs every 6 (six) hours as needed. For shortness of breath     . AMPHETAMINE-DEXTROAMPHETAMINE 10 MG PO CP24 Oral Take 10 mg by mouth every morning.        BP 135/92  Pulse 77  Temp(Src) 97.5 F (36.4 C) (Oral)  Resp 18  Wt 134 lb (60.782 kg)  SpO2 100%  LMP 07/25/2011  Physical Exam  Constitutional: She is oriented to person, place, and time. She appears well-developed and well-nourished.  HENT:  Head: Normocephalic and atraumatic.  Eyes: Conjunctivae and EOM are normal. Pupils are equal, round, and reactive to light.  Neck: Trachea normal. Neck supple. No thyromegaly present.  Cardiovascular: Normal rate, regular rhythm, S1 normal, S2 normal and normal pulses.     No systolic murmur is present   No diastolic murmur is present  Pulses:      Radial pulses are 2+ on the right side, and 2+ on the left side.  Pulmonary/Chest: Effort normal and breath sounds  normal. She has no wheezes. She has no rhonchi. She has no rales. She exhibits no tenderness.  Abdominal: Soft. Normal appearance and bowel sounds are normal. There is no CVA tenderness and negative Murphy's sign.       Tender palpation over the right lower quadrant, no rebound or guarding. No discoloration or ecchymosis.  Musculoskeletal:       BLE:s Calves nontender, no cords or erythema, negative Homans sign  Neurological: She is alert and oriented to person, place, and time. She has normal strength. No cranial nerve deficit or sensory deficit. GCS eye subscore is 4. GCS verbal subscore is 5. GCS motor subscore is 6.  Skin: Skin is warm and dry. No rash noted. She is not diaphoretic.  Psychiatric: Her speech is normal.       Cooperative and appropriate    ED Course  Procedures (including critical care time)  Ct Abdomen Pelvis Wo Contrast  08/31/2011  *RADIOLOGY REPORT*  Clinical Data: Right-sided abdominal pain, nausea history of cholecystectomy  CT ABDOMEN AND PELVIS WITHOUT CONTRAST  Technique:  Multidetector CT imaging of the abdomen and pelvis was performed following the standard protocol without intravenous contrast.  Comparison: Abdominal CT - 11/05/2007  Findings:  The lack of intravenous contrast limits the Billie to evaluate solid abdominal organs.  Normal hepatic contour.  Post cholecystectomy.  No ascites.  Normal noncontrast appearance of the bilateral kidneys.  No radiopaque renal stones.  No evidence of hydronephrosis.  No perinephric stranding.  Noncontrast appearance of the bilateral adrenal glands, pancreas and spleen is normal.  Colonic diverticulosis without evidence of diverticulitis on this noncontrast examination.  The bowel is otherwise normal in course and caliber without wall thickening or evidence of obstruction. Normal noncontrast appearance of the appendix.  No pneumoperitoneum, pneumatosis or portal venous gas.  Normal caliber abdominal aorta.  No retroperitoneal,  mesenteric, pelvic or inguinal lymphadenopathy.  Pelvic organs are normal for age.  No free fluid in the pelvis.  Limited visualization of the lower thorax is negative for focal airspace opacity or pleural effusion.  Normal heart size.  No pericardial effusion.  No acute aggressive osseous abnormalities.  IMPRESSION:  1.  No explanation for patient's right-sided abdominal pain and nausea.  Specifically, no renal stones or evidence of urinary obstruction.  No evidence of enteric obstruction.  Normal appendix.  2. Colonic diverticulosis without evidence of diverticulitis.  3. Post cholecystectomy.  Original Report Authenticated By: Waynard Reeds, M.D.    Results for orders placed during the hospital encounter of 08/30/11  URINALYSIS, ROUTINE W REFLEX MICROSCOPIC      Component Value Range   Color, Urine YELLOW  YELLOW    Appearance CLOUDY (*) CLEAR    Specific Gravity, Urine 1.015  1.005 - 1.030    pH 8.5 (*) 5.0 - 8.0    Glucose, UA NEGATIVE  NEGATIVE (mg/dL)   Hgb urine  dipstick NEGATIVE  NEGATIVE    Bilirubin Urine NEGATIVE  NEGATIVE    Ketones, ur NEGATIVE  NEGATIVE (mg/dL)   Protein, ur NEGATIVE  NEGATIVE (mg/dL)   Urobilinogen, UA 0.2  0.0 - 1.0 (mg/dL)   Nitrite NEGATIVE  NEGATIVE    Leukocytes, UA TRACE (*) NEGATIVE   PREGNANCY, URINE      Component Value Range   Preg Test, Ur NEGATIVE    URINE MICROSCOPIC-ADD ON      Component Value Range   Squamous Epithelial / LPF FEW (*) RARE    WBC, UA 0-2  <3 (WBC/hpf)   Bacteria, UA RARE  RARE   CBC      Component Value Range   WBC 6.4  4.0 - 10.5 (K/uL)   RBC 3.95  3.87 - 5.11 (MIL/uL)   Hemoglobin 11.2 (*) 12.0 - 15.0 (g/dL)   HCT 78.2 (*) 95.6 - 46.0 (%)   MCV 83.8  78.0 - 100.0 (fL)   MCH 28.4  26.0 - 34.0 (pg)   MCHC 33.8  30.0 - 36.0 (g/dL)   RDW 21.3  08.6 - 57.8 (%)   Platelets 213  150 - 400 (K/uL)  DIFFERENTIAL      Component Value Range   Neutrophils Relative 42 (*) 43 - 77 (%)   Neutro Abs 2.7  1.7 - 7.7 (K/uL)    Lymphocytes Relative 49 (*) 12 - 46 (%)   Lymphs Abs 3.2  0.7 - 4.0 (K/uL)   Monocytes Relative 6  3 - 12 (%)   Monocytes Absolute 0.4  0.1 - 1.0 (K/uL)   Eosinophils Relative 3  0 - 5 (%)   Eosinophils Absolute 0.2  0.0 - 0.7 (K/uL)   Basophils Relative 1  0 - 1 (%)   Basophils Absolute 0.0  0.0 - 0.1 (K/uL)  COMPREHENSIVE METABOLIC PANEL      Component Value Range   Sodium 138  135 - 145 (mEq/L)   Potassium 3.4 (*) 3.5 - 5.1 (mEq/L)   Chloride 104  96 - 112 (mEq/L)   CO2 26  19 - 32 (mEq/L)   Glucose, Bld 116 (*) 70 - 99 (mg/dL)   BUN 10  6 - 23 (mg/dL)   Creatinine, Ser 4.69  0.50 - 1.10 (mg/dL)   Calcium 9.1  8.4 - 62.9 (mg/dL)   Total Protein 6.8  6.0 - 8.3 (g/dL)   Albumin 3.7  3.5 - 5.2 (g/dL)   AST 13  0 - 37 (U/L)   ALT 10  0 - 35 (U/L)   Alkaline Phosphatase 44  39 - 117 (U/L)   Total Bilirubin 0.2 (*) 0.3 - 1.2 (mg/dL)   GFR calc non Af Amer >90  >90 (mL/min)   GFR calc Af Amer >90  >90 (mL/min)  LIPASE, BLOOD      Component Value Range   Lipase 37  11 - 59 (U/L)   Ct Abdomen Pelvis Wo Contrast  08/31/2011  *RADIOLOGY REPORT*  Clinical Data: Right-sided abdominal pain, nausea history of cholecystectomy  CT ABDOMEN AND PELVIS WITHOUT CONTRAST  Technique:  Multidetector CT imaging of the abdomen and pelvis was performed following the standard protocol without intravenous contrast.  Comparison: Abdominal CT - 11/05/2007  Findings:  The lack of intravenous contrast limits the Billie to evaluate solid abdominal organs.  Normal hepatic contour.  Post cholecystectomy.  No ascites.  Normal noncontrast appearance of the bilateral kidneys.  No radiopaque renal stones.  No evidence of hydronephrosis.  No perinephric stranding.  Noncontrast appearance of the bilateral adrenal glands, pancreas and spleen is normal.  Colonic diverticulosis without evidence of diverticulitis on this noncontrast examination.  The bowel is otherwise normal in course and caliber without wall thickening or  evidence of obstruction. Normal noncontrast appearance of the appendix.  No pneumoperitoneum, pneumatosis or portal venous gas.  Normal caliber abdominal aorta.  No retroperitoneal, mesenteric, pelvic or inguinal lymphadenopathy.  Pelvic organs are normal for age.  No free fluid in the pelvis.  Limited visualization of the lower thorax is negative for focal airspace opacity or pleural effusion.  Normal heart size.  No pericardial effusion.  No acute aggressive osseous abnormalities.  IMPRESSION:  1.  No explanation for patient's right-sided abdominal pain and nausea.  Specifically, no renal stones or evidence of urinary obstruction.  No evidence of enteric obstruction.  Normal appendix.  2. Colonic diverticulosis without evidence of diverticulitis.  3. Post cholecystectomy.  Original Report Authenticated By: Waynard Reeds, M.D.      MDM   Given tenderness of the right lower quadrant and concern for possible appendicitis, patient was evaluated with CT scan as above. Labs were obtained and reviewed. Patient was given IV dialogue and Phenergan her symptoms and on rechecks, her pain is improved and she is much more comfortable. No peritonitis serial exams. for IBS and diverticulosis, GI referral provided. Prescription for Phenergan provided. Patient declines prescription for pain medication. Potassium for mild hypokalemia. Stable for discharge home        Sunnie Nielsen, MD 08/31/11 [phone]

## 2011-08-31 NOTE — ED Notes (Signed)
Patient is resting comfortably.family at side supportive of care plan Pt encouraged to complete Po contrast

## 2011-08-31 NOTE — ED Notes (Signed)
Care plan and follow up with GI reviewed with use of phenegren for nausea

## 2011-09-03 LAB — HEPATIC FUNCTION PANEL
ALT: 10
AST: 16
Albumin: 4.4
Alkaline Phosphatase: 36 — ABNORMAL LOW
Indirect Bilirubin: 0.5
Total Protein: 7.2

## 2011-09-03 LAB — BASIC METABOLIC PANEL
BUN: 7
CO2: 23
Calcium: 9.1
Chloride: 106
Creatinine, Ser: 0.72
GFR calc Af Amer: >60
GFR calc non Af Amer: >60
Glucose, Bld: 100 — ABNORMAL HIGH
Potassium: 3.6
Sodium: 136

## 2011-09-03 LAB — DIFFERENTIAL
Basophils Absolute: 0
Basophils Absolute: 0.1
Basophils Relative: 0
Basophils Relative: 1
Eosinophils Absolute: 0.1 — ABNORMAL LOW
Eosinophils Relative: 1
Lymphocytes Relative: 39
Lymphs Abs: 2.6
Monocytes Absolute: 0.3
Monocytes Relative: 5
Neutro Abs: 3.7
Neutro Abs: 5.3
Neutrophils Relative %: 55
Neutrophils Relative %: 55

## 2011-09-03 LAB — POCT PREGNANCY, URINE
Operator id: 15132
Preg Test, Ur: NEGATIVE

## 2011-09-03 LAB — POCT URINALYSIS DIP (DEVICE)
Bilirubin Urine: NEGATIVE
Nitrite: NEGATIVE
Protein, ur: NEGATIVE
Urobilinogen, UA: 0.2
pH: 7

## 2011-09-03 LAB — URINALYSIS, ROUTINE W REFLEX MICROSCOPIC
Bilirubin Urine: NEGATIVE
Glucose, UA: NEGATIVE
Ketones, ur: NEGATIVE
Leukocytes, UA: NEGATIVE
Nitrite: NEGATIVE
Protein, ur: NEGATIVE
Protein, ur: NEGATIVE
Specific Gravity, Urine: 1.01
Urobilinogen, UA: 0.2
Urobilinogen, UA: 1
pH: 7

## 2011-09-03 LAB — URINE MICROSCOPIC-ADD ON

## 2011-09-03 LAB — RPR: RPR Ser Ql: NONREACTIVE

## 2011-09-03 LAB — CBC
HCT: 35.6 — ABNORMAL LOW
HCT: 36.1
Hemoglobin: 12.2
Hemoglobin: 12.2
MCHC: 33.9
MCV: 82.8
MCV: 83.8
Platelets: 218
RBC: 4.3
RDW: 13.7
RDW: 13.8
WBC: 6.8

## 2011-09-03 LAB — COMPREHENSIVE METABOLIC PANEL
Alkaline Phosphatase: 48
BUN: 11
Chloride: 101
Glucose, Bld: 94
Potassium: 3.4 — ABNORMAL LOW
Total Bilirubin: 0.7

## 2011-09-03 LAB — WET PREP, GENITAL: WBC, Wet Prep HPF POC: NONE SEEN

## 2011-09-03 LAB — LIPASE, BLOOD: Lipase: 24

## 2011-09-13 LAB — URINALYSIS, ROUTINE W REFLEX MICROSCOPIC
Leukocytes, UA: NEGATIVE
Nitrite: NEGATIVE
Specific Gravity, Urine: 1.011 (ref 1.005–1.035)
Urobilinogen, UA: 0.2
pH: 6

## 2011-09-13 LAB — DIFFERENTIAL
Basophils Absolute: 0
Lymphocytes Relative: 37
Lymphs Abs: 2.6
Monocytes Absolute: 0.4
Neutro Abs: 3.8

## 2011-09-13 LAB — COMPREHENSIVE METABOLIC PANEL
Albumin: 4.2
BUN: 9
Calcium: 9.5
Chloride: 104
Creatinine, Ser: 0.76
GFR calc non Af Amer: >60
Total Bilirubin: 0.7

## 2011-09-13 LAB — LIPASE, BLOOD: Lipase: 28

## 2011-09-13 LAB — CBC
HCT: 38.6
MCHC: 33.4
MCV: 80.2
Platelets: 259
WBC: 7

## 2012-02-24 ENCOUNTER — Emergency Department (HOSPITAL_BASED_OUTPATIENT_CLINIC_OR_DEPARTMENT_OTHER)
Admission: EM | Admit: 2012-02-24 | Discharge: 2012-02-24 | Disposition: A | Discharge status: Home / Self Care | Payer: Self-pay | Attending: Emergency Medicine | Admitting: Emergency Medicine

## 2012-02-24 ENCOUNTER — Encounter (HOSPITAL_BASED_OUTPATIENT_CLINIC_OR_DEPARTMENT_OTHER): Payer: Self-pay | Admitting: *Deleted

## 2012-02-24 DIAGNOSIS — R42 Dizziness and giddiness: Secondary | ICD-10-CM | POA: Insufficient documentation

## 2012-02-24 DIAGNOSIS — K589 Irritable bowel syndrome without diarrhea: Secondary | ICD-10-CM | POA: Insufficient documentation

## 2012-02-24 DIAGNOSIS — R51 Headache: Secondary | ICD-10-CM | POA: Insufficient documentation

## 2012-02-24 DIAGNOSIS — R5381 Other malaise: Secondary | ICD-10-CM | POA: Insufficient documentation

## 2012-02-24 LAB — PREGNANCY, URINE: Preg Test, Ur: NEGATIVE

## 2012-02-24 LAB — BASIC METABOLIC PANEL
Chloride: 104 mEq/L (ref 96–112)
Creatinine, Ser: 0.8 mg/dL (ref 0.50–1.10)
GFR calc Af Amer: >90 mL/min (ref >90)
Potassium: 3.5 mEq/L (ref 3.5–5.1)
Sodium: 138 mEq/L (ref 135–145)

## 2012-02-24 LAB — CBC
Platelets: 182 10*3/uL (ref 150–400)
RDW: 12.9 % (ref 11.5–15.5)
WBC: 6.7 10*3/uL (ref 4.0–10.5)

## 2012-02-24 MED ORDER — SODIUM CHLORIDE 0.9 % IV BOLUS (SEPSIS)
1000.0000 mL | Freq: Once | INTRAVENOUS | Status: AC
  Administered 2012-02-24: 1000 mL via INTRAVENOUS

## 2012-02-24 MED ORDER — METOCLOPRAMIDE HCL 5 MG/ML IJ SOLN
10.0000 mg | Freq: Once | INTRAMUSCULAR | Status: AC
  Administered 2012-02-24: 10 mg via INTRAVENOUS
  Filled 2012-02-24: qty 2

## 2012-02-24 MED ORDER — LORAZEPAM 2 MG/ML IJ SOLN
INTRAMUSCULAR | Status: AC
  Filled 2012-02-24: qty 1

## 2012-02-24 MED ORDER — DIPHENHYDRAMINE HCL 50 MG/ML IJ SOLN
25.0000 mg | Freq: Once | INTRAMUSCULAR | Status: AC
  Administered 2012-02-24: 25 mg via INTRAVENOUS

## 2012-02-24 MED ORDER — KETOROLAC TROMETHAMINE 30 MG/ML IJ SOLN
30.0000 mg | Freq: Once | INTRAMUSCULAR | Status: AC
  Administered 2012-02-24: 30 mg via INTRAVENOUS
  Filled 2012-02-24: qty 1

## 2012-02-24 MED ORDER — DIPHENHYDRAMINE HCL 50 MG/ML IJ SOLN
INTRAMUSCULAR | Status: AC
  Filled 2012-02-24: qty 1

## 2012-02-24 MED ORDER — LORAZEPAM 2 MG/ML IJ SOLN
1.0000 mg | Freq: Once | INTRAMUSCULAR | Status: AC
  Administered 2012-02-24: 1 mg via INTRAVENOUS

## 2012-02-24 NOTE — Discharge Instructions (Signed)

## 2012-02-24 NOTE — ED Provider Notes (Signed)
History     CSN: [id number]  Arrival date & time 02/24/12  0409   First MD Initiated Contact with Patient 02/24/12 0413      Chief Complaint  Patient presents with  . Dizziness     The history is provided by the patient and a relative.   patient reports that the last 3 nights she is woken up she's had a severe throbbing headache that is worse with light and loud noises.  She reports she's had associated lightheadedness with this as well.  She denies vertiginous symptoms.  She's had no weakness of her arms or legs.  She reports this evening she woke she was having a pounding headache and had generalized weakness all over.  She's had no nausea or vomiting.  She's had no diarrhea.  She's been eating and drinking normally.  She's had no fevers chills.  She denies chest pain or palpitations.  She's had no shortness of breath.  She has a history of postural hypotension and hypokalemia.  She does not take her Adderall the weekends only Monday through Friday or when she is working through the weekend.  The patient reports that lightheadedness is common for her and that the headaches that she gets are often.  They're usually throbbing in nature and they're worse with light and loud noises.  She's never seen a neurologist or a shaver been diagnosed with migraine headaches.  She's felt fine through the day for some reason she's had these headaches at night  Past Medical History  Diagnosis Date  . Hypokalemia   . Pneumonia   . Irritable bowel syndrome (IBS)   . Gastritis   . Hypotension, postural   . Tachycardia     Past Surgical History  Procedure Date  . Cholecystectomy   . Cesarean section     No family history on file.  History  Substance Use Topics  . Smoking status: Current Everyday Smoker  . Smokeless tobacco: Not on file  . Alcohol Use: No    OB History    Grav Para Term Preterm Abortions TAB SAB Ect Mult Living                  Review of Systems  All other systems  reviewed and are negative.    Allergies  Aspirin; Cefaclor; Chocolate; Nasal spray; Ondansetron hcl; Peanut-containing drug products; Iodinated diagnostic agents; and Latex  Home Medications   Current Outpatient Rx  Name Route Sig Dispense Refill  . ACETAMINOPHEN 500 MG PO TABS Oral Take 1,000 mg by mouth once as needed. For pain     . AMPHETAMINE-DEXTROAMPHET ER 10 MG PO CP24 Oral Take 10 mg by mouth every morning.      Marland Kitchen AMPHETAMINE-DEXTROAMPHETAMINE 20 MG PO TABS Oral Take 20 mg by mouth 4 (four) times daily.      . ALBUTEROL 90 MCG/ACT IN AERS Inhalation Inhale 2 puffs into the lungs every 6 (six) hours as needed. For shortness of breath     . BENZONATATE 200 MG PO CAPS Oral Take 200 mg by mouth 3 (three) times daily as needed. For cough     . RANITIDINE HCL 150 MG PO TABS Oral Take 150 mg by mouth once as needed. For indigestion       BP 104/55  Pulse 68  Temp(Src) 98.1 F (36.7 C) (Oral)  Resp 16  SpO2 99%  LMP 02/17/2012  Physical Exam  Nursing note and vitals reviewed. Constitutional: She is oriented to person, place,  and time. She appears well-developed and well-nourished. No distress.  HENT:  Head: Normocephalic and atraumatic.  Eyes: EOM are normal. Pupils are equal, round, and reactive to light.  Neck: Normal range of motion.  Cardiovascular: Normal rate, regular rhythm and normal heart sounds.   Pulmonary/Chest: Effort normal and breath sounds normal.  Abdominal: Soft. She exhibits no distension. There is no tenderness.  Musculoskeletal: Normal range of motion.  Neurological: She is alert and oriented to person, place, and time.       5/5 strength in major muscle groups of  bilateral upper and lower extremities. Speech normal. No facial asymetry.   Skin: Skin is warm and dry.  Psychiatric: She has a normal mood and affect. Judgment normal.    ED Course  Procedures (including critical care time)  Labs Reviewed  CBC - Abnormal; Notable for the following:     Hemoglobin 11.8 (*)    HCT 34.8 (*)    All other components within normal limits  PREGNANCY, URINE  BASIC METABOLIC PANEL   No results found.   1. Headache   2. Lightheaded       MDM  She has a normal neurologic exam at this time.  My suspicion for an intracranial mass is very low.  She's been referred to neurologist for ongoing workup of her headaches at some point she will likely require imaging of her brain.  With a normal neurologic exam now this does not need to be completed on an emergent basis.  She was given Reglan which helped her headache however she developed akathisias,  which improved with Benadryl and Ativan.        Lyanne Co, MD 02/24/12 [phone]

## 2012-02-24 NOTE — ED Notes (Signed)
Offered pt wheelchair pt denies need. Pt d/c with her mother

## 2012-02-24 NOTE — ED Notes (Signed)
MD at bedside. 

## 2012-02-24 NOTE — ED Notes (Signed)
Pt says when she wakes up in the middle of the night she is dizzy x3 nights.

## 2012-07-10 ENCOUNTER — Emergency Department (HOSPITAL_BASED_OUTPATIENT_CLINIC_OR_DEPARTMENT_OTHER)
Admission: EM | Admit: 2012-07-10 | Discharge: 2012-07-11 | Disposition: A | Discharge status: Home / Self Care | Payer: Self-pay | Attending: Emergency Medicine | Admitting: Emergency Medicine

## 2012-07-10 ENCOUNTER — Emergency Department (HOSPITAL_BASED_OUTPATIENT_CLINIC_OR_DEPARTMENT_OTHER): Payer: Self-pay

## 2012-07-10 ENCOUNTER — Encounter (HOSPITAL_BASED_OUTPATIENT_CLINIC_OR_DEPARTMENT_OTHER): Payer: Self-pay | Admitting: *Deleted

## 2012-07-10 DIAGNOSIS — K2970 Gastritis, unspecified, without bleeding: Secondary | ICD-10-CM | POA: Insufficient documentation

## 2012-07-10 DIAGNOSIS — Z886 Allergy status to analgesic agent status: Secondary | ICD-10-CM | POA: Insufficient documentation

## 2012-07-10 DIAGNOSIS — Z888 Allergy status to other drugs, medicaments and biological substances status: Secondary | ICD-10-CM | POA: Insufficient documentation

## 2012-07-10 DIAGNOSIS — Z79899 Other long term (current) drug therapy: Secondary | ICD-10-CM | POA: Insufficient documentation

## 2012-07-10 DIAGNOSIS — F17200 Nicotine dependence, unspecified, uncomplicated: Secondary | ICD-10-CM | POA: Insufficient documentation

## 2012-07-10 DIAGNOSIS — Z881 Allergy status to other antibiotic agents status: Secondary | ICD-10-CM | POA: Insufficient documentation

## 2012-07-10 DIAGNOSIS — K589 Irritable bowel syndrome without diarrhea: Secondary | ICD-10-CM | POA: Insufficient documentation

## 2012-07-10 DIAGNOSIS — S6990XA Unspecified injury of unspecified wrist, hand and finger(s), initial encounter: Secondary | ICD-10-CM | POA: Insufficient documentation

## 2012-07-10 DIAGNOSIS — S6720XA Crushing injury of unspecified hand, initial encounter: Secondary | ICD-10-CM

## 2012-07-10 DIAGNOSIS — W230XXA Caught, crushed, jammed, or pinched between moving objects, initial encounter: Secondary | ICD-10-CM | POA: Insufficient documentation

## 2012-07-10 DIAGNOSIS — Z91040 Latex allergy status: Secondary | ICD-10-CM | POA: Insufficient documentation

## 2012-07-10 NOTE — ED Notes (Signed)
Box fell on her right hand 4 days ago. Swelling and pain.

## 2012-07-11 MED ORDER — HYDROCODONE-ACETAMINOPHEN 5-325 MG PO TABS
1.0000 | ORAL_TABLET | Freq: Once | ORAL | Status: AC
  Administered 2012-07-11: 1 via ORAL
  Filled 2012-07-11: qty 1

## 2012-07-11 MED ORDER — HYDROCODONE-ACETAMINOPHEN 5-325 MG PO TABS: 1.0000 | ORAL_TABLET | Freq: Four times a day (QID) | ORAL | Status: AC | PRN

## 2012-07-11 NOTE — ED Provider Notes (Signed)
History     CSN: [id number]  Arrival date & time 2012/07/20  2258   First MD Initiated Contact with Patient 07/11/12 0018      Chief Complaint  Patient presents with  . Hand Injury    (Consider location/radiation/quality/duration/timing/severity/associated sxs/prior treatment) HPI Is a 26 year old white female who dropped a box on her right hand 4 days ago. She subsequently had moderate to severe pain overlying the right fourth and fifth metacarpals. The pain radiates to the associated fingers distally as well as the ulnar side of the forearm proximally. The pain is worse with palpation or movement and range of motion is limited due to pain. She's having decreased sensation in the right fifth finger. She states it intermittently swells. There is no associated ecchymosis.  Past Medical History  Diagnosis Date  . Hypokalemia   . Pneumonia   . Irritable bowel syndrome (IBS)   . Gastritis   . Hypotension, postural   . Tachycardia     Past Surgical History  Procedure Date  . Cholecystectomy   . Cesarean section     No family history on file.  History  Substance Use Topics  . Smoking status: Current Everyday Smoker  . Smokeless tobacco: Not on file  . Alcohol Use: No    OB History    Grav Para Term Preterm Abortions TAB SAB Ect Mult Living                  Review of Systems  All other systems reviewed and are negative.    Allergies  Peanut-containing drug products; Aspirin; Cefaclor; Chocolate; Nasal spray; Ondansetron hcl; Iodine; and Latex  Home Medications   Current Outpatient Rx  Name Route Sig Dispense Refill  . AMPHETAMINE-DEXTROAMPHETAMINE 20 MG PO TABS Oral Take 20 mg by mouth 5 (five) times daily.     Marland Kitchen CLONAZEPAM 0.5 MG PO TABS Oral Take 0.5 mg by mouth 2 (two) times daily as needed. For anxiety    . EPINEPHRINE 0.3 MG/0.3ML IJ DEVI Intramuscular Inject 0.3 mg into the muscle once.    Marland Kitchen RANITIDINE HCL 150 MG PO TABS Oral Take 150 mg by mouth once as  needed. For indigestion     . TRAZODONE HCL 100 MG PO TABS Oral Take 150 mg by mouth at bedtime. For sleep      BP 103/67  Pulse 78  Temp 98.2 F (36.8 C) (Oral)  Resp 20  SpO2 100%  Physical Exam General: Well-developed, well-nourished female in no acute distress; appearance consistent with age of record HENT: normocephalic, atraumatic Eyes: Normal appearance Neck: supple Heart: regular rate and rhythm Lungs: Normal respiratory effort and excursion Abdomen: soft; nondistended Extremities: No deformity; tenderness of right hand dorsally over the right fourth and fifth metacarpals, right fourth and fifth fingers and right ulnar wrist; decreased range of motion of right wrist and right hand due to pain; decreased sensation of right fifth finger; no swelling or ecchymosis present Neurologic: Awake, alert and oriented; motor function intact in all extremities and symmetric, with limited range of motion of right hand and wrist as noted above; no facial droop Skin: Warm and dry Psychiatric: Normal mood and affect    ED Course  Procedures (including critical care time)     MDM   Nursing notes and vitals signs, including pulse oximetry, reviewed.  Summary of this visit's results, reviewed by myself:  Imaging Studies: Dg Hand Complete Right  07/20/2012  *RADIOLOGY REPORT*  Clinical Data: Pain post blunt trauma.  RIGHT HAND - COMPLETE 3+ VIEW  Comparison: None.  Findings: Negative for fracture, dislocation, or other acute abnormality.  Normal alignment and mineralization. No significant degenerative change.  Regional soft tissues unremarkable.  IMPRESSION:  Negative  Original Report Authenticated By: Osa Craver, M.D.            Hanley Seamen, MD 07/11/12 [phone]

## 2013-09-29 ENCOUNTER — Telehealth: Payer: Self-pay | Admitting: Internal Medicine

## 2013-09-29 NOTE — Telephone Encounter (Signed)
Pt states she has been having nausea and vomiting after she eats. Pt states this has been going on for a while. Pt scheduled to see Willette Cluster NP 10/01/13@10am . Pt aware of appt date and time.

## 2013-10-01 ENCOUNTER — Ambulatory Visit (INDEPENDENT_AMBULATORY_CARE_PROVIDER_SITE_OTHER): Payer: Medicaid Other | Admitting: Gastroenterology

## 2013-10-01 ENCOUNTER — Ambulatory Visit: Payer: Self-pay | Admitting: Nurse Practitioner

## 2013-10-01 ENCOUNTER — Encounter: Payer: Self-pay | Admitting: Gastroenterology

## 2013-10-01 ENCOUNTER — Other Ambulatory Visit (INDEPENDENT_AMBULATORY_CARE_PROVIDER_SITE_OTHER): Payer: Medicaid Other

## 2013-10-01 VITALS — BP 100/60 | HR 66 | Ht 65.0 in | Wt 135.6 lb

## 2013-10-01 DIAGNOSIS — R112 Nausea with vomiting, unspecified: Secondary | ICD-10-CM

## 2013-10-01 DIAGNOSIS — R109 Unspecified abdominal pain: Secondary | ICD-10-CM

## 2013-10-01 DIAGNOSIS — K5900 Constipation, unspecified: Secondary | ICD-10-CM

## 2013-10-01 LAB — CBC
Hemoglobin: 13.5 g/dL (ref 12.0–15.0)
Platelets: 221 10*3/uL (ref 150.0–400.0)
RBC: 4.57 Mil/uL (ref 3.87–5.11)
RDW: 13.3 % (ref 11.5–14.6)
WBC: 5.1 10*3/uL (ref 4.5–10.5)

## 2013-10-01 LAB — COMPREHENSIVE METABOLIC PANEL
ALT: 12 U/L (ref 0–35)
CO2: 27 mEq/L (ref 19–32)
Calcium: 9.5 mg/dL (ref 8.4–10.5)
Chloride: 103 mEq/L (ref 96–112)
Creatinine, Ser: 0.7 mg/dL (ref 0.4–1.2)
GFR: 100.2 mL/min (ref >60.00)

## 2013-10-01 LAB — TSH: TSH: 1.04 u[IU]/mL (ref 0.35–5.50)

## 2013-10-01 MED ORDER — LUBIPROSTONE 24 MCG PO CAPS: 24.0000 ug | ORAL_CAPSULE | Freq: Two times a day (BID) | ORAL | Status: DC

## 2013-10-01 MED ORDER — MOVIPREP 100 G PO SOLR: ORAL | Status: DC

## 2013-10-01 MED ORDER — PROMETHAZINE HCL 12.5 MG RE SUPP: 12.5000 mg | Freq: Four times a day (QID) | RECTAL | Status: DC | PRN

## 2013-10-01 NOTE — Progress Notes (Signed)
10/01/2013 Meredith Burns [id number] 1986/09/29   HISTORY OF PRESENT ILLNESS:  Patient is a 27 year old female with past medical history of IBS, anxiety, and chronic headaches. She is known to Dr. Marina Goodell for evaluation in 2008 and last in January 2009. At that time she presented with complaints of nausea, vomiting, and abdominal pain. She was given a diagnosis of constipation predominant irritable bowel syndrome even prior to that visit. She underwent EGD and colonoscopy by Dr. Marina Goodell sometime in 2008. Colonoscopy, including intubation of the terminal ileum, at that time was normal. Upper endoscopy was unremarkable except for a hiatal hernia as well as prolapsing mucosa consistent with reflux disease.  Those procedures were listed in his note, but we were not able to find the reports in any of our systems.  She underwent a cholecystectomy for a HIDA scan showing decreased gallbladder ejection fraction and ongoing complaints as stated above.  She had also been seen previously by Dr. Kinnie Scales in 2003 and an EGD was performed around that time as well. We have a pathology report showing gastric biopsies showing benign mucosa and no H. pylori. Last CT scan of the abdomen and pelvis without contrast was in October 2012, which showed only colonic diverticulosis without diverticulitis. She had also been seen previously when she was younger at [facility].  Anyway, she presents for office today with complaints of severe constipation, nausea and vomiting, and abdominal pain. She reports that she has only had 2 bowel movements in the last 3 months. When she does have a bowel movement it is excruciatingly painful. She says she has tried everything over-the-counter to help her have a bowel movement including magnesium citrate, however, that did not help. She says that she has not been able to eat or drink much at all without vomiting.  She has constant nausea. Says that even if she takes one bite of food  that she'll vomit at times. She is taking a lot of over-the-counter Zantac. She complains of diffuse abdominal pain. She denies complaints of heartburn or indigestion.    Past Medical History  Diagnosis Date  . Hypokalemia   . Pneumonia   . Irritable bowel syndrome (IBS)   . Gastritis   . Hypotension, postural   . Tachycardia   . Anemia   . Anxiety   . Chronic headaches    Past Surgical History  Procedure Laterality Date  . Cholecystectomy    . Cesarean section      x 3     reports that she has been smoking Cigarettes.  She has a .55 pack-year smoking history. She has never used smokeless tobacco. She reports that she does not drink alcohol or use illicit drugs. family history includes Colon cancer in her father; Colon polyps in her father; Heart disease in her father; Irritable bowel syndrome in her mother. Allergies  Allergen Reactions  . Peanut-Containing Drug Products Anaphylaxis  . Aspirin Swelling  . Cefaclor Diarrhea and Nausea And Vomiting  . Chocolate Swelling  . Nasal Spray Swelling    Face turns red and swells up  . Ondansetron Hcl Other (See Comments)    Tremors   . Iodine Rash  . Latex Rash      Outpatient Encounter Prescriptions as of 10/01/2013  Medication Sig  . amphetamine-dextroamphetamine (ADDERALL) 20 MG tablet Take 20 mg by mouth 5 (five) times daily.   Marland Kitchen EPINEPHrine (EPIPEN) 0.3 mg/0.3 mL DEVI Inject 0.3 mg into the muscle once.  . ranitidine (ZANTAC) 150 MG tablet  Take 150 mg by mouth once as needed. For indigestion   . lubiprostone (AMITIZA) 24 MCG capsule Take 1 capsule (24 mcg total) by mouth 2 (two) times daily with a meal.  . MOVIPREP 100 G SOLR Use per prep instruction  . promethazine (PHENERGAN) 12.5 MG suppository Place 1 suppository (12.5 mg total) rectally every 6 (six) hours as needed for nausea or vomiting.  . [DISCONTINUED] clonazePAM (KLONOPIN) 0.5 MG tablet Take 0.5 mg by mouth 2 (two) times daily as needed. For anxiety  .  [DISCONTINUED] traZODone (DESYREL) 100 MG tablet Take 150 mg by mouth at bedtime. For sleep     REVIEW OF SYSTEMS  : All other systems reviewed and negative except where noted in the History of Present Illness.   PHYSICAL EXAM: BP 100/60  Pulse 66  Ht 5\' 5"  (1.651 m)  Wt 135 lb 9.6 oz (61.508 kg)  BMI 22.57 kg/m2  LMP 09/27/2013 General: Well developed white female in no acute distress Head: Normocephalic and atraumatic Eyes:  Sclerae anicteric, conjunctiva pink. Ears:  Normal auditory acuity. Lungs: Clear throughout to auscultation. Heart: Regular rate and rhythm. Abdomen: Soft, non-distended.  Normal bowel sounds.  Diffuse TTP even with the lightest pressure, but abdomen benign.  Patient crying after exam. Musculoskeletal: Symmetrical with no gross deformities  Skin: No lesions on visible extremities Extremities: No edema  Neurological: Alert oriented x 4, grossly non-focal. Psychological:  Alert and cooperative. Normal mood and affect  ASSESSMENT AND PLAN: -Constipation:  Severe, with only reportedly 2 bowel movements in the last 3 months. We will check labs including a CBC, CMP, TSH, and celiac titers. Will give her amitiza 24 mcg to take twice daily with food. We will schedule her for colonoscopy for further evaluation, however, I suspect this is likely functional. Question need for Sitzmarks for study, etc. pending results of colonoscopy. -Nausea and vomiting:  We'll schedule for EGD for further evaluation. We'll give Phenergan suppositories to use when necessary. Check labs as stated above. -Abdominal pain:  Diffuse.  Likely functional.  Check labs and studies stated above.    The risks, benefits, and alternatives of colonoscopy and EGD were discussed with the patient and she consents to proceed.

## 2013-10-01 NOTE — Patient Instructions (Addendum)
You have been given a separate informational sheet regarding your tobacco use, the importance of quitting and local resources to help you quit. Your physician has requested that you go to the basement for the following lab work before leaving today: CBC, CMET, TSH. CELIAC  You have been scheduled for a colonoscopy/Endoscopy with propofol. Please follow written instructions given to you at your visit today.  Please pick up your prep kit at the pharmacy within the next 1-3 days. If you use inhalers (even only as needed), please bring them with you on the day of your procedure. Your physician has requested that you go to www.startemmi.com and enter the access code given to you at your visit today. This web site gives a general overview about your procedure. However, you should still follow specific instructions given to you by our office regarding your preparation for the procedure.  We have sent the following medications to your pharmacy for you to pick up at your convenience: Moviprep, Amitiza; take this medication twice a day with meals. Phenergan take every 8 hours as needed for nausea.                                               We are excited to introduce MyChart, a new best-in-class service that provides you online access to important information in your electronic medical record. We want to make it easier for you to view your health information - all in one secure location - when and where you need it. We expect MyChart will enhance the quality of care and service we provide.  When you register for MyChart, you can:    View your test results.    Request appointments and receive appointment reminders via email.    Request medication renewals.    View your medical history, allergies, medications and immunizations.    Communicate with your physician's office through a password-protected site.    Conveniently print information such as your medication lists.  To find out if MyChart is  right for you, please talk to a member of our clinical staff today. We will gladly answer your questions about this free health and wellness tool.  If you are age 29 or older and want a member of your family to have access to your record, you must provide written consent by completing a proxy form available at our office. Please speak to our clinical staff about guidelines regarding accounts for patients younger than age 41.  As you activate your MyChart account and need any technical assistance, please call the MyChart technical support line at [phone]) or email your question to [email or web address]. If you email your question(s), please include your name, a return phone number and the best time to reach you.  If you have non-urgent health-related questions, you can send a message to our office through MyChart at [email or web address]. If you have a medical emergency, call 911.  Thank you for using MyChart as your new health and wellness resource!   MyChart licensed from [employer],  4540-9811. Patents Pending.

## 2013-10-05 ENCOUNTER — Ambulatory Visit: Payer: Self-pay | Admitting: Physician Assistant

## 2013-10-05 NOTE — Progress Notes (Signed)
Agree with initial assessment and plans 

## 2013-10-27 ENCOUNTER — Encounter: Payer: Self-pay | Admitting: Internal Medicine

## 2013-10-27 ENCOUNTER — Ambulatory Visit (AMBULATORY_SURGERY_CENTER): Payer: Medicaid Other | Admitting: Internal Medicine

## 2013-10-27 VITALS — BP 94/62 | HR 50 | Temp 97.7°F | Resp 19 | Ht 65.0 in | Wt 135.0 lb

## 2013-10-27 DIAGNOSIS — Z800 Family history of malignant neoplasm of digestive organs: Secondary | ICD-10-CM

## 2013-10-27 DIAGNOSIS — K5900 Constipation, unspecified: Secondary | ICD-10-CM

## 2013-10-27 DIAGNOSIS — R112 Nausea with vomiting, unspecified: Secondary | ICD-10-CM

## 2013-10-27 DIAGNOSIS — R109 Unspecified abdominal pain: Secondary | ICD-10-CM

## 2013-10-27 MED ORDER — SODIUM CHLORIDE 0.9 % IV SOLN: 500.0000 mL | INTRAVENOUS | Status: DC

## 2013-10-27 NOTE — Op Note (Signed)
[facility] [address] [place], [address]   COLONOSCOPY PROCEDURE REPORT  PATIENT: Meredith Burns, Meredith Burns  MR#: [id number] BIRTHDATE: 10/19/1986 , 27  yrs. old GENDER: Female ENDOSCOPIST: Roxy Cedar, MD REFERRED BY:.  Self / Office PROCEDURE DATE:  10/27/2013 PROCEDURE:   Colonoscopy, diagnostic First Screening Colonoscopy - Avg.  risk and is 50 yrs.  old or older - No.  Prior Negative Screening - Now for repeat screening. N/A  History of Adenoma - Now for follow-up colonoscopy & has been > or = to 3 yrs.  N/A  Polyps Removed Today? No.  Recommend repeat exam, <10 yrs? No. ASA CLASS:   Class II INDICATIONS:Abdominal pain, Constipation, and Patient's immediate family history of colon cancer(father @ 49). MEDICATIONS: MAC sedation, administered by CRNA and propofol (Diprivan) 380mg  IV  DESCRIPTION OF PROCEDURE:   After the risks benefits and alternatives of the procedure were thoroughly explained, informed consent was obtained.  A digital rectal exam revealed no abnormalities of the rectum.   The LB [id number]  endoscope was introduced through the anus and advanced to the cecum, which was identified by both the appendix and ileocecal valve. No adverse events experienced.   The quality of the prep was excellent, using MoviPrep  The instrument was then slowly withdrawn as the colon was fully examined.      COLON FINDINGS: A normal appearing cecum, ileocecal valve, and appendiceal orifice were identified.  The ascending, hepatic flexure, transverse, splenic flexure, descending, sigmoid colon and rectum appeared unremarkable.  No polyps or cancers were seen. Retroflexed views revealed no abnormalities. The time to cecum=3 minutes 09 seconds.  Withdrawal time=8 minutes 01 seconds.  The scope was withdrawn and the procedure completed.  COMPLICATIONS: There were no complications.  ENDOSCOPIC IMPRESSION: 1. Normal colon 2. Constipation predominant  IBS  RECOMMENDATIONS: 1.  Upper endoscopy today (see reports) 2.  Linzess 290 mcg once daily. Multiple samples given. May prescribed if this is helpful 2.  Call office for follow-up appointment in 4-6 weeks 3. Repeat colonoscopy in 10 years (family history)   eSigned:  Roxy Cedar, MD 10/27/2013 4:45 PM   cc: The Patient and Antony Haste, MD

## 2013-10-27 NOTE — Progress Notes (Signed)
Report to pacu rn, vss, bbs=clear 

## 2013-10-27 NOTE — Patient Instructions (Signed)
Discharge instructions given with verbal understanding. Handout on constipation. Resume previous medications. Will call office for follow up appointment for 4-6 weeks. Also office will schedule a gastric emptying scan. YOU HAD AN ENDOSCOPIC PROCEDURE TODAY AT THE [facility]: Refer to the procedure report that was given to you for any specific questions about what was found during the examination.  If the procedure report does not answer your questions, please call your gastroenterologist to clarify.  If you requested that your care partner not be given the details of your procedure findings, then the procedure report has been included in a sealed envelope for you to review at your convenience later.  YOU SHOULD EXPECT: Some feelings of bloating in the abdomen. Passage of more gas than usual.  Walking can help get rid of the air that was put into your GI tract during the procedure and reduce the bloating. If you had a lower endoscopy (such as a colonoscopy or flexible sigmoidoscopy) you may notice spotting of blood in your stool or on the toilet paper. If you underwent a bowel prep for your procedure, then you may not have a normal bowel movement for a few days.  DIET: Your first meal following the procedure should be a light meal and then it is ok to progress to your normal diet.  A half-sandwich or bowl of soup is an example of a good first meal.  Heavy or fried foods are harder to digest and may make you feel nauseous or bloated.  Likewise meals heavy in dairy and vegetables can cause extra gas to form and this can also increase the bloating.  Drink plenty of fluids but you should avoid alcoholic beverages for 24 hours.  ACTIVITY: Your care partner should take you home directly after the procedure.  You should plan to take it easy, moving slowly for the rest of the day.  You can resume normal activity the day after the procedure however you should NOT DRIVE or use heavy machinery for 24  hours (because of the sedation medicines used during the test).    SYMPTOMS TO REPORT IMMEDIATELY: A gastroenterologist can be reached at any hour.  During normal business hours, 8:30 AM to 5:00 PM Monday through Friday, call [phone].  After hours and on weekends, please call the GI answering service at [phone] who will take a message and have the physician on call contact you.   Following lower endoscopy (colonoscopy or flexible sigmoidoscopy):  Excessive amounts of blood in the stool  Significant tenderness or worsening of abdominal pains  Swelling of the abdomen that is new, acute  Fever of 100F or higher  Following upper endoscopy (EGD)  Vomiting of blood or coffee ground material  New chest pain or pain under the shoulder blades  Painful or persistently difficult swallowing  New shortness of breath  Fever of 100F or higher  Black, tarry-looking stools  FOLLOW UP: If any biopsies were taken you will be contacted by phone or by letter within the next 1-3 weeks.  Call your gastroenterologist if you have not heard about the biopsies in 3 weeks.  Our staff will call the home number listed on your records the next business day following your procedure to check on you and address any questions or concerns that you may have at that time regarding the information given to you following your procedure. This is a courtesy call and so if there is no answer at the home number and we have  not heard from you through the emergency physician on call, we will assume that you have returned to your regular daily activities without incident.  SIGNATURES/CONFIDENTIALITY: You and/or your care partner have signed paperwork which will be entered into your electronic medical record.  These signatures attest to the fact that that the information above on your After Visit Summary has been reviewed and is understood.  Full responsibility of the confidentiality of this discharge information lies with  you and/or your care-partner.

## 2013-10-27 NOTE — Progress Notes (Signed)
Patient did not experience any of the following events: a burn prior to discharge; a fall within the facility; wrong site/side/patient/procedure/implant event; or a hospital transfer or hospital admission upon discharge from the facility. (G8907) Patient did not have preoperative order for IV antibiotic SSI prophylaxis. (G8918)  

## 2013-10-27 NOTE — Op Note (Signed)
[facility] [address] [place], [address]   ENDOSCOPY PROCEDURE REPORT  PATIENT: Indiah, Heyden  MR#: [id number] BIRTHDATE: 1986/09/09 , 27  yrs. old GENDER: Female ENDOSCOPIST: Roxy Cedar, MD REFERRED BY:  .  Self / Office PROCEDURE DATE:  10/27/2013 PROCEDURE:  EGD, diagnostic ASA CLASS:     Class II INDICATIONS:  Nausea.   Vomiting. MEDICATIONS: MAC sedation, administered by CRNA and propofol (Diprivan) 90mg  IV TOPICAL ANESTHETIC: Cetacaine Spray  DESCRIPTION OF PROCEDURE: After the risks benefits and alternatives of the procedure were thoroughly explained, informed consent was obtained.  The LB [id number] endoscope was introduced through the mouth and advanced to the second portion of the duodenum. Without limitations.  The instrument was slowly withdrawn as the mucosa was fully examined.      EXAM:The upper, middle and distal third of the esophagus were carefully inspected and no abnormalities were noted.  The z-line was well seen at the GEJ.  The endoscope was pushed into the fundus which was normal including a retroflexed view.  The antrum, gastric body, first and second part of the duodenum were unremarkable. Retroflexed views revealed no abnormalities.     The scope was then withdrawn from the patient and the procedure completed.  COMPLICATIONS: There were no complications. ENDOSCOPIC IMPRESSION: 1.Normal EGD  RECOMMENDATIONS: 1.My office will arrange for you to have a Solid phase Gastric Emptying Scan performed "rule out gastroparesis".  This is a radiology test that gives an idea of how well your stomach functions. 2. Office followup in 4-6 weeks  REPEAT EXAM:  eSigned:  Roxy Cedar, MD 10/27/2013 4:49 PM   HY:[id number] Cyndia Bent, MD and The Patient

## 2013-10-28 ENCOUNTER — Telehealth: Payer: Self-pay | Admitting: *Deleted

## 2013-10-28 ENCOUNTER — Other Ambulatory Visit: Payer: Self-pay

## 2013-10-28 DIAGNOSIS — R109 Unspecified abdominal pain: Secondary | ICD-10-CM

## 2013-10-28 NOTE — Telephone Encounter (Signed)
Left message on number given in admitting to return call if problems questions or concerns. ewm 

## 2013-10-29 ENCOUNTER — Telehealth: Payer: Self-pay

## 2013-10-29 NOTE — Telephone Encounter (Signed)
Pt scheduled for GES at [facility] 11/16/13. Pt to arrive there at 9:15am for a 9:30am appt. Pt to be NPO after midnight and hold zantac for 24 hours prior to exam. Pt aware of appt.

## 2013-11-16 ENCOUNTER — Encounter (HOSPITAL_COMMUNITY)
Admission: RE | Admit: 2013-11-16 | Discharge: 2013-11-16 | Disposition: A | Discharge status: Home / Self Care | Payer: Medicaid Other | Source: Ambulatory Visit | Attending: Internal Medicine | Admitting: Internal Medicine

## 2013-11-16 DIAGNOSIS — R109 Unspecified abdominal pain: Secondary | ICD-10-CM | POA: Insufficient documentation

## 2013-12-01 ENCOUNTER — Telehealth: Payer: Self-pay | Admitting: Internal Medicine

## 2013-12-01 ENCOUNTER — Ambulatory Visit: Payer: Medicaid Other | Admitting: Internal Medicine

## 2013-12-08 ENCOUNTER — Encounter (HOSPITAL_COMMUNITY): Payer: Medicaid Other

## 2013-12-15 ENCOUNTER — Encounter: Payer: Self-pay | Admitting: Internal Medicine

## 2013-12-15 ENCOUNTER — Ambulatory Visit (HOSPITAL_COMMUNITY): Payer: Medicaid Other | Attending: Internal Medicine

## 2013-12-15 ENCOUNTER — Ambulatory Visit (INDEPENDENT_AMBULATORY_CARE_PROVIDER_SITE_OTHER): Payer: Medicaid Other | Admitting: Internal Medicine

## 2013-12-15 VITALS — BP 100/66 | HR 76 | Ht 65.0 in | Wt 135.6 lb

## 2013-12-15 DIAGNOSIS — K5900 Constipation, unspecified: Secondary | ICD-10-CM

## 2013-12-15 DIAGNOSIS — R112 Nausea with vomiting, unspecified: Secondary | ICD-10-CM

## 2013-12-15 DIAGNOSIS — R109 Unspecified abdominal pain: Secondary | ICD-10-CM

## 2013-12-15 DIAGNOSIS — Z800 Family history of malignant neoplasm of digestive organs: Secondary | ICD-10-CM

## 2013-12-15 NOTE — Patient Instructions (Signed)
Please follow up with Dr. Perry as needed 

## 2013-12-15 NOTE — Progress Notes (Signed)
HISTORY OF PRESENT ILLNESS:  Meredith Burns is a 28 y.o. female with chronic anxiety, chronic headaches, and functional abdominal complaints. She presents today for followup. Evaluated in 2008 in 2009 for nausea, vomiting, and abdominal pain. No organic cause found. More recently reevaluated for similar complaints and constipation 10/01/2013. At that time multiple laboratories were obtained and returned unremarkable or normal. Trial of Amitiza was not helpful. Complete colonoscopy and upper endoscopy were performed 10/27/2013. These were normal. Multiple samples of Linzess 290 mcg daily provided. She took these for about 6 weeks. He reports that they were not helpful. Possibly increased frequency of bowel movements, but some increased abdominal discomfort. Was to have had gastric emptying scan this morning, but states that she "could not do". When food in her mouth, she spit it out. Probably psychiatric medications Klonopin. She is no longer seeing a psychiatrist formally. Despite reports of ongoing severe GI symptoms for almost 3 months, her weight remains unchanged. She has 3 young children at home. She does mention worsening problems with anxiety. She has been on other medications for her GI symptoms including several antiemetics, acid suppressing agents, etc. These have either been ineffective or not tolerated.  REVIEW OF SYSTEMS:  All non-GI ROS negative except for anxiety and headaches  Past Medical History  Diagnosis Date  . Hypokalemia   . Pneumonia   . Irritable bowel syndrome (IBS)   . Gastritis   . Hypotension, postural   . Tachycardia   . Anemia   . Anxiety   . Chronic headaches   . Diverticulosis   . Hiatal hernia     Past Surgical History  Procedure Laterality Date  . Cholecystectomy    . Cesarean section      x 3     Social History Meredith Burns  reports that she has been smoking Cigarettes.  She has a 5.5 pack-year smoking history. She has never used smokeless  tobacco. She reports that she does not drink alcohol or use illicit drugs.  family history includes Colon cancer in her father; Colon polyps in her father; Heart disease in her father; Irritable bowel syndrome in her mother.  Allergies  Allergen Reactions  . Peanut-Containing Drug Products Anaphylaxis  . Aspirin Swelling  . Cefaclor Diarrhea and Nausea And Vomiting  . Chocolate Swelling  . Nasal Spray Swelling    Face turns red and swells up  . Ondansetron Hcl Other (See Comments)    Tremors   . Iodine Rash  . Latex Rash       PHYSICAL EXAMINATION: Vital signs: BP 100/66  Pulse 76  Ht 5\' 5"  (1.651 m)  Wt 135 lb 9.6 oz (61.508 kg)  BMI 22.57 kg/m2  LMP 11/14/2013 General: Well-developed, well-nourished, no acute distress HEENT: Sclerae are anicteric, conjunctiva pink. Oral mucosa intact Lungs: Clear Heart: Regular Abdomen: soft, nontender, nondistended, no obvious ascites, no peritoneal signs, normal bowel sounds. No organomegaly. Extremities: No edema Psychiatric: alert and oriented x3. Cooperative   ASSESSMENT:  #1. Multiple GI complaints. Extensive evaluation without revalation of organic cause. Suspect functional, as previous, and related to underlying anxiety/depression disorder. I discussed this with her candidally and in significant detail. I told her that I did not feel that ongoing treatment with GI medications would be of benefit. I want her to have a formal psychiatric evaluation and treatment. If successful, do feel that her GI symptoms would improve.

## 2014-03-29 NOTE — Telephone Encounter (Signed)
PT NOT BILLED SAME DAY CX FEE

## 2014-04-01 ENCOUNTER — Emergency Department: Payer: Self-pay | Admitting: Emergency Medicine

## 2014-04-01 LAB — CBC
HCT: 38.3 % (ref 35.0–47.0)
HGB: 12.6 g/dL (ref 12.0–16.0)
MCH: 29 pg (ref 26.0–34.0)
MCHC: 33 g/dL (ref 32.0–36.0)
MCV: 88 fL (ref 80–100)
Platelet: 173 10*3/uL (ref 150–440)
RBC: 4.35 10*6/uL (ref 3.80–5.20)
RDW: 13 % (ref 11.5–14.5)
WBC: 5.9 10*3/uL (ref 3.6–11.0)

## 2014-04-01 LAB — URINALYSIS, COMPLETE
BACTERIA: NONE SEEN
BLOOD: NEGATIVE
Bilirubin,UR: NEGATIVE
Glucose,UR: NEGATIVE mg/dL (ref 0–75)
KETONE: NEGATIVE
Leukocyte Esterase: NEGATIVE
Nitrite: NEGATIVE
Ph: 8 (ref 4.5–8.0)
Protein: NEGATIVE
RBC,UR: <1 /HPF (ref 0–5)
Specific Gravity: 1.003 (ref 1.003–1.030)
Squamous Epithelial: 2

## 2014-04-01 LAB — BASIC METABOLIC PANEL
ANION GAP: 7 (ref 7–16)
BUN: 11 mg/dL (ref 7–18)
CHLORIDE: 105 mmol/L (ref 98–107)
CO2: 26 mmol/L (ref 21–32)
CREATININE: 0.84 mg/dL (ref 0.60–1.30)
Calcium, Total: 8.8 mg/dL (ref 8.5–10.1)
EGFR (African American): >60
EGFR (Non-African Amer.): >60
Glucose: 100 mg/dL — ABNORMAL HIGH (ref 65–99)
Osmolality: 275 (ref 275–301)
Potassium: 3.3 mmol/L — ABNORMAL LOW (ref 3.5–5.1)
Sodium: 138 mmol/L (ref 136–145)

## 2014-04-01 LAB — PRO B NATRIURETIC PEPTIDE: B-Type Natriuretic Peptide: 58 pg/mL (ref 0–125)

## 2014-04-01 LAB — PROTIME-INR
INR: 1
Prothrombin Time: 13.1 secs (ref 11.5–14.7)

## 2014-04-01 LAB — TROPONIN I: Troponin-I: <0.02 ng/mL

## 2014-04-01 LAB — D-DIMER(ARMC): D-DIMER: 305 ng/mL

## 2014-07-21 ENCOUNTER — Encounter: Payer: Self-pay | Admitting: Internal Medicine

## 2014-10-04 DIAGNOSIS — Z90710 Acquired absence of both cervix and uterus: Secondary | ICD-10-CM | POA: Insufficient documentation

## 2015-02-10 ENCOUNTER — Encounter (HOSPITAL_COMMUNITY): Payer: Self-pay | Admitting: Emergency Medicine

## 2015-02-10 ENCOUNTER — Emergency Department (HOSPITAL_COMMUNITY): Payer: Medicaid Other

## 2015-02-10 ENCOUNTER — Emergency Department (HOSPITAL_COMMUNITY)
Admission: EM | Admit: 2015-02-10 | Discharge: 2015-02-10 | Disposition: A | Discharge status: Home / Self Care | Payer: Medicaid Other | Attending: Emergency Medicine | Admitting: Emergency Medicine

## 2015-02-10 DIAGNOSIS — Z9889 Other specified postprocedural states: Secondary | ICD-10-CM | POA: Diagnosis not present

## 2015-02-10 DIAGNOSIS — Z8701 Personal history of pneumonia (recurrent): Secondary | ICD-10-CM | POA: Diagnosis not present

## 2015-02-10 DIAGNOSIS — Z79899 Other long term (current) drug therapy: Secondary | ICD-10-CM | POA: Diagnosis not present

## 2015-02-10 DIAGNOSIS — Z8639 Personal history of other endocrine, nutritional and metabolic disease: Secondary | ICD-10-CM | POA: Insufficient documentation

## 2015-02-10 DIAGNOSIS — Z9049 Acquired absence of other specified parts of digestive tract: Secondary | ICD-10-CM | POA: Diagnosis not present

## 2015-02-10 DIAGNOSIS — Z91040 Latex allergy status: Secondary | ICD-10-CM | POA: Insufficient documentation

## 2015-02-10 DIAGNOSIS — Z3202 Encounter for pregnancy test, result negative: Secondary | ICD-10-CM | POA: Insufficient documentation

## 2015-02-10 DIAGNOSIS — Z720 Tobacco use: Secondary | ICD-10-CM | POA: Diagnosis not present

## 2015-02-10 DIAGNOSIS — R109 Unspecified abdominal pain: Secondary | ICD-10-CM | POA: Diagnosis present

## 2015-02-10 DIAGNOSIS — Z8719 Personal history of other diseases of the digestive system: Secondary | ICD-10-CM | POA: Insufficient documentation

## 2015-02-10 DIAGNOSIS — G8929 Other chronic pain: Secondary | ICD-10-CM | POA: Insufficient documentation

## 2015-02-10 DIAGNOSIS — F319 Bipolar disorder, unspecified: Secondary | ICD-10-CM | POA: Diagnosis not present

## 2015-02-10 LAB — COMPREHENSIVE METABOLIC PANEL
ALBUMIN: 4.1 g/dL (ref 3.5–5.2)
ALT: 12 U/L (ref 0–35)
AST: 18 U/L (ref 0–37)
Alkaline Phosphatase: 31 U/L — ABNORMAL LOW (ref 39–117)
Anion gap: 8 (ref 5–15)
BUN: 5 mg/dL — ABNORMAL LOW (ref 6–23)
CALCIUM: 9.3 mg/dL (ref 8.4–10.5)
CO2: 24 mmol/L (ref 19–32)
CREATININE: 0.75 mg/dL (ref 0.50–1.10)
Chloride: 105 mmol/L (ref 96–112)
GFR calc Af Amer: >90 mL/min (ref >90)
GFR calc non Af Amer: >90 mL/min (ref >90)
Glucose, Bld: 110 mg/dL — ABNORMAL HIGH (ref 70–99)
Potassium: 3.8 mmol/L (ref 3.5–5.1)
Sodium: 137 mmol/L (ref 135–145)
TOTAL PROTEIN: 6.9 g/dL (ref 6.0–8.3)
Total Bilirubin: 0.7 mg/dL (ref 0.3–1.2)

## 2015-02-10 LAB — URINALYSIS, ROUTINE W REFLEX MICROSCOPIC
Bilirubin Urine: NEGATIVE
Glucose, UA: NEGATIVE mg/dL
KETONES UR: NEGATIVE mg/dL
Nitrite: NEGATIVE
PH: 7.5 (ref 5.0–8.0)
PROTEIN: NEGATIVE mg/dL
SPECIFIC GRAVITY, URINE: 1.005 (ref 1.005–1.030)
Urobilinogen, UA: 0.2 mg/dL (ref 0.0–1.0)

## 2015-02-10 LAB — CBC WITH DIFFERENTIAL/PLATELET
BASOS ABS: 0 10*3/uL (ref 0.0–0.1)
Basophils Relative: 0 % (ref 0–1)
Eosinophils Absolute: 0.1 10*3/uL (ref 0.0–0.7)
Eosinophils Relative: 2 % (ref 0–5)
HCT: 36.5 % (ref 36.0–46.0)
HEMOGLOBIN: 12.4 g/dL (ref 12.0–15.0)
Lymphocytes Relative: 36 % (ref 12–46)
Lymphs Abs: 1.6 10*3/uL (ref 0.7–4.0)
MCH: 29.4 pg (ref 26.0–34.0)
MCHC: 34 g/dL (ref 30.0–36.0)
MCV: 86.5 fL (ref 78.0–100.0)
Monocytes Absolute: 0.3 10*3/uL (ref 0.1–1.0)
Monocytes Relative: 6 % (ref 3–12)
Neutro Abs: 2.5 10*3/uL (ref 1.7–7.7)
Neutrophils Relative %: 56 % (ref 43–77)
Platelets: 190 10*3/uL (ref 150–400)
RBC: 4.22 MIL/uL (ref 3.87–5.11)
RDW: 12.8 % (ref 11.5–15.5)
WBC: 4.4 10*3/uL (ref 4.0–10.5)

## 2015-02-10 LAB — URINE MICROSCOPIC-ADD ON

## 2015-02-10 LAB — POC URINE PREG, ED: Preg Test, Ur: NEGATIVE

## 2015-02-10 LAB — LIPASE, BLOOD: Lipase: 25 U/L (ref 11–59)

## 2015-02-10 MED ORDER — METOCLOPRAMIDE HCL 10 MG PO TABS: 10.0000 mg | ORAL_TABLET | Freq: Four times a day (QID) | ORAL | Status: DC | PRN

## 2015-02-10 MED ORDER — SODIUM CHLORIDE 0.9 % IV SOLN
1000.0000 mL | Freq: Once | INTRAVENOUS | Status: AC
  Administered 2015-02-10: 1000 mL via INTRAVENOUS

## 2015-02-10 MED ORDER — METOCLOPRAMIDE HCL 5 MG/ML IJ SOLN
10.0000 mg | Freq: Once | INTRAMUSCULAR | Status: AC
  Administered 2015-02-10: 10 mg via INTRAVENOUS
  Filled 2015-02-10: qty 2

## 2015-02-10 MED ORDER — DIPHENHYDRAMINE HCL 50 MG/ML IJ SOLN
25.0000 mg | Freq: Once | INTRAMUSCULAR | Status: AC
  Administered 2015-02-10: 25 mg via INTRAVENOUS
  Filled 2015-02-10: qty 1

## 2015-02-10 MED ORDER — DICYCLOMINE HCL 20 MG PO TABS: 20.0000 mg | ORAL_TABLET | Freq: Two times a day (BID) | ORAL | Status: DC

## 2015-02-10 MED ORDER — SODIUM CHLORIDE 0.9 % IV SOLN: 1000.0000 mL | INTRAVENOUS | Status: DC

## 2015-02-10 MED ORDER — DICYCLOMINE HCL 10 MG PO CAPS
10.0000 mg | ORAL_CAPSULE | Freq: Once | ORAL | Status: AC
  Administered 2015-02-10: 10 mg via ORAL
  Filled 2015-02-10: qty 1

## 2015-02-10 NOTE — ED Notes (Signed)
Per Dr Colvin Caroli, drink contrast only, no IV.

## 2015-02-10 NOTE — ED Provider Notes (Signed)
CSN: [id number]     Arrival date & time 02/10/15  0846 History   First MD Initiated Contact with Patient 02/10/15 0913     Chief Complaint  Patient presents with  . Abdominal Pain     (Consider location/radiation/quality/duration/timing/severity/associated sxs/prior Treatment) HPI The patient has had chronic abdominal pain. She reports she had a hysterectomy and a right oophorectomy to try to resolve the problem. This was done 3 months ago. This has not helped. She also reports she had a cholecystectomy and an endoscopy within the last year. There is no specific diagnosis for her recurrent episodes of abdominal pain. She reports she hasn't felt well this week. She's had decreased appetite. She reports her abdominal pain started in the epigastric region and was sharp in nature. Over the past 2 days it has migrated down to her right lower quadrant and is sharp in nature. She reports 4 episodes of vomiting today. She reports normal bowel movement yesterday. No fever or urinary symptoms associated. She denies that she has any specific medication or treatment for this condition. Past Medical History  Diagnosis Date  . Hypokalemia   . Pneumonia   . Irritable bowel syndrome (IBS)   . Gastritis   . Hypotension, postural   . Tachycardia   . Anemia   . Anxiety   . Chronic headaches   . Diverticulosis   . Hiatal hernia    Past Surgical History  Procedure Laterality Date  . Cholecystectomy    . Cesarean section      x 3    Family History  Problem Relation Age of Onset  . Colon cancer Father   . Colon polyps Father   . Heart disease Father   . Irritable bowel syndrome Mother    History  Substance Use Topics  . Smoking status: Current Every Day Smoker -- 0.50 packs/day for 11 years    Types: Cigarettes  . Smokeless tobacco: Never Used  . Alcohol Use: No   OB History    No data available     Review of Systems  10 Systems reviewed and are negative for acute change except as noted  in the HPI.   Allergies  Peanut-containing drug products; Prednisone; Aspirin; Cefaclor; Chocolate; Nasal spray; Ondansetron hcl; Iodine; and Latex  Home Medications   Prior to Admission medications   Medication Sig Start Date End Date Taking? Authorizing Provider  acetaminophen (TYLENOL) 500 MG tablet Take 1,000 mg by mouth every 8 (eight) hours as needed for mild pain or moderate pain.   Yes Historical Provider, MD  amphetamine-dextroamphetamine (ADDERALL) 20 MG tablet Take 20 mg by mouth 5 (five) times daily.    Yes Historical Provider, MD  EPINEPHrine (EPIPEN) 0.3 mg/0.3 mL DEVI Inject 0.3 mg into the muscle once.   Yes Historical Provider, MD  HYDROcodone-acetaminophen (NORCO) 10-325 MG per tablet Take 1 tablet by mouth every 6 (six) hours as needed for moderate pain or severe pain.   Yes Historical Provider, MD  ranitidine (ZANTAC) 150 MG tablet Take 150 mg by mouth once as needed. For indigestion    Yes Historical Provider, MD  dicyclomine (BENTYL) 20 MG tablet Take 1 tablet (20 mg total) by mouth 2 (two) times daily. 02/10/15   Charlesetta Shanks, MD  lubiprostone (AMITIZA) 24 MCG capsule Take 1 capsule (24 mcg total) by mouth 2 (two) times daily with a meal. Patient not taking: Reported on 02/10/2015 10/01/13   Janett Billow D Zehr, PA-C  metoCLOPramide (REGLAN) 10 MG tablet Take 1  tablet (10 mg total) by mouth every 6 (six) hours as needed for nausea or vomiting (nausea/headache). 02/10/15   Charlesetta Shanks, MD  promethazine (PHENERGAN) 12.5 MG suppository Place 1 suppository (12.5 mg total) rectally every 6 (six) hours as needed for nausea or vomiting. Patient not taking: Reported on 02/10/2015 10/01/13   Laban Emperor Zehr, PA-C  promethazine (PHENERGAN) 25 MG tablet Take 1 tablet (25 mg total) by mouth every 6 (six) hours as needed for nausea. 08/31/11 09/07/11  Teressa Lower, MD   BP 91/55 mmHg  Pulse 61  Temp(Src) 97.8 F (36.6 C) (Oral)  Resp 18  SpO2 100%  LMP 11/14/2013 Physical Exam   Constitutional: She is oriented to person, place, and time. She appears well-developed and well-nourished.  The patient is anxious and tearful. She has no respiratory distress. Her mental status is clear. She is very alert. Her general appearance is well.  HENT:  Head: Normocephalic and atraumatic.  Eyes: EOM are normal. Pupils are equal, round, and reactive to light.  Neck: Neck supple.  Cardiovascular: Normal rate, regular rhythm, normal heart sounds and intact distal pulses.   Pulmonary/Chest: Effort normal and breath sounds normal.  Abdominal: Soft. Bowel sounds are normal. She exhibits no distension. There is tenderness.  Patient endorses discomfort to palpation from the right upper quadrant to the right lower quadrant. There is no guarding present. No palpable mass. The abdomen is soft. Normal bowel sounds.  Musculoskeletal: Normal range of motion. She exhibits no edema.  Neurological: She is alert and oriented to person, place, and time. She has normal strength. Coordination normal. GCS eye subscore is 4. GCS verbal subscore is 5. GCS motor subscore is 6.  Skin: Skin is warm, dry and intact.  The patient does not have any rash. She is fairly deeply tanned. There is mild skin sloughing on the back however no areas of acute burn.  Psychiatric:  The patient is anxious and tearful in appearance.    ED Course  Procedures (including critical care time) Labs Review Labs Reviewed  COMPREHENSIVE METABOLIC PANEL - Abnormal; Notable for the following:    Glucose, Bld 110 (*)    BUN 5 (*)    Alkaline Phosphatase 31 (*)    All other components within normal limits  URINALYSIS, ROUTINE W REFLEX MICROSCOPIC - Abnormal; Notable for the following:    APPearance CLOUDY (*)    Hgb urine dipstick TRACE (*)    Leukocytes, UA LARGE (*)    All other components within normal limits  URINE CULTURE  LIPASE, BLOOD  CBC WITH DIFFERENTIAL/PLATELET  URINE MICROSCOPIC-ADD ON  POC URINE PREG, ED     Imaging Review Dg Abd Acute W/chest  02/10/2015   CLINICAL DATA:  Right lower quadrant abdominal pain, history of irritable bowel syndrome, pain for 1 day  EXAM: ACUTE ABDOMEN SERIES (ABDOMEN 2 VIEW & CHEST 1 VIEW)  COMPARISON:  08/31/2011  FINDINGS: Heart size and vascular pattern normal. No infiltrate or effusion. 1.5 cm nodular opacity right lower lobe possibly nipple shadow. No free air. No pleural effusion.  No abnormally dilated loops of bowel. There are scattered air-fluid levels on the decubitus view in small bowel. Gas is seen into the colon.  IMPRESSION: 1. Nondilated bowel with air-fluid levels suggesting ileus or enteritis 2. Nodular opacity over the right lower lobe possibly a nipple shadow. Recommend repeating the PA view with nipple markers to confirm this.   Electronically Signed   By: Skipper Cliche M.D.   On: 02/10/2015  10:40   Ct Renal Stone Study  02/10/2015   CLINICAL DATA:  Right-sided abdominal pain for 1 week, progressively worsening. IV contrast allergy  EXAM: CT ABDOMEN AND PELVIS WITHOUT CONTRAST  TECHNIQUE: Multidetector CT imaging of the abdomen and pelvis was performed following the standard protocol without IV contrast.  COMPARISON:  11/08/2012  FINDINGS: BODY WALL: No contributory findings.  LOWER CHEST: No contributory findings.  ABDOMEN/PELVIS:  Liver: No focal abnormality.  Biliary: Cholecystectomy. Punctate calcification in the liver hilum is stable from previous.  Pancreas: Unremarkable.  Spleen: Unremarkable.  Adrenals: Unremarkable.  Kidneys and ureters: Punctate, nonobstructive stone in the interpolar right kidney. No hydronephrosis or ureteral calculus .  Bladder: Unremarkable.  Reproductive: Hysterectomy. When accounting for left-sided follicle, symmetric and normal appearance of the ovaries.  Bowel: No obstruction. Normal appendix.  Retroperitoneum: No mass or adenopathy.  Peritoneum: No ascites or pneumoperitoneum.  Vascular: No acute abnormality.  OSSEOUS: No  acute abnormalities.  IMPRESSION: 1. No appendicitis or other acute intra-abdominal finding. 2. Punctate right nephrolithiasis.   Electronically Signed   By: Monte Fantasia M.D.   On: 02/10/2015 13:41     EKG Interpretation None      MDM   Final diagnoses:  Recurrent abdominal pain   The patient has had chronic recurrent abdominal pain. She has had both her gallbladder and her uterus removed due to recurrent pain. These have not improved the pain episodes that she experienced. Today there is no evidence of a surgical etiology. CT shows no abnormality and there is no leukocytosis or guarding on physical examination. The patient's general appearance is well. Based on her history and findings are suggestive of irritable bowel. The patient will be treated with Bentyl and Reglan for symptomatic control. She is counseled to follow-up with her treating physician to continue management of chronic intermittent abdominal pain.    Charlesetta Shanks, MD 02/10/15 [id number]

## 2015-02-10 NOTE — Discharge Instructions (Signed)
Abdominal Pain, Women °Abdominal (stomach, pelvic, or belly) pain can be caused by many things. It is important to tell your doctor: °· The location of the pain. °· Does it come and go or is it present all the time? °· Are there things that start the pain (eating certain foods, exercise)? °· Are there other symptoms associated with the pain (fever, nausea, vomiting, diarrhea)? °All of this is helpful to know when trying to find the cause of the pain. °CAUSES  °· Stomach: virus or bacteria infection, or ulcer. °· Intestine: appendicitis (inflamed appendix), regional ileitis (Crohn's disease), ulcerative colitis (inflamed colon), irritable bowel syndrome, diverticulitis (inflamed diverticulum of the colon), or cancer of the stomach or intestine. °· Gallbladder disease or stones in the gallbladder. °· Kidney disease, kidney stones, or infection. °· Pancreas infection or cancer. °· Fibromyalgia (pain disorder). °· Diseases of the female organs: °¨ Uterus: fibroid (non-cancerous) tumors or infection. °¨ Fallopian tubes: infection or tubal pregnancy. °¨ Ovary: cysts or tumors. °¨ Pelvic adhesions (scar tissue). °¨ Endometriosis (uterus lining tissue growing in the pelvis and on the pelvic organs). °¨ Pelvic congestion syndrome (female organs filling up with blood just before the menstrual period). °¨ Pain with the menstrual period. °¨ Pain with ovulation (producing an egg). °¨ Pain with an IUD (intrauterine device, birth control) in the uterus. °¨ Cancer of the female organs. °· Functional pain (pain not caused by a disease, may improve without treatment). °· Psychological pain. °· Depression. °DIAGNOSIS  °Your doctor will decide the seriousness of your pain by doing an examination. °· Blood tests. °· X-rays. °· Ultrasound. °· CT scan (computed tomography, special type of X-ray). °· MRI (magnetic resonance imaging). °· Cultures, for infection. °· Barium enema (dye inserted in the large intestine, to better view it with  X-rays). °· Colonoscopy (looking in intestine with a lighted tube). °· Laparoscopy (minor surgery, looking in abdomen with a lighted tube). °· Major abdominal exploratory surgery (looking in abdomen with a large incision). °TREATMENT  °The treatment will depend on the cause of the pain.  °· Many cases can be observed and treated at home. °· Over-the-counter medicines recommended by your caregiver. °· Prescription medicine. °· Antibiotics, for infection. °· Birth control pills, for painful periods or for ovulation pain. °· Hormone treatment, for endometriosis. °· Nerve blocking injections. °· Physical therapy. °· Antidepressants. °· Counseling with a psychologist or psychiatrist. °· Minor or major surgery. °HOME CARE INSTRUCTIONS  °· Do not take laxatives, unless directed by your caregiver. °· Take over-the-counter pain medicine only if ordered by your caregiver. Do not take aspirin because it can cause an upset stomach or bleeding. °· Try a clear liquid diet (broth or water) as ordered by your caregiver. Slowly move to a bland diet, as tolerated, if the pain is related to the stomach or intestine. °· Have a thermometer and take your temperature several times a day, and record it. °· Bed rest and sleep, if it helps the pain. °· Avoid sexual intercourse, if it causes pain. °· Avoid stressful situations. °· Keep your follow-up appointments and tests, as your caregiver orders. °· If the pain does not go away with medicine or surgery, you may try: °¨ Acupuncture. °¨ Relaxation exercises (yoga, meditation). °¨ Group therapy. °¨ Counseling. °SEEK MEDICAL CARE IF:  °· You notice certain foods cause stomach pain. °· Your home care treatment is not helping your pain. °· You need stronger pain medicine. °· You want your IUD removed. °· You feel faint or   lightheaded. °· You develop nausea and vomiting. °· You develop a rash. °· You are having side effects or an allergy to your medicine. °SEEK IMMEDIATE MEDICAL CARE IF:  °· Your  pain does not go away or gets worse. °· You have a fever. °· Your pain is felt only in portions of the abdomen. The right side could possibly be appendicitis. The left lower portion of the abdomen could be colitis or diverticulitis. °· You are passing blood in your stools (bright red or black tarry stools, with or without vomiting). °· You have blood in your urine. °· You develop chills, with or without a fever. °· You pass out. °MAKE SURE YOU:  °· Understand these instructions. °· Will watch your condition. °· Will get help right away if you are not doing well or get worse. °Document Released: 09/09/2007 Document Revised: 03/29/2014 Document Reviewed: 09/29/2009 °[employer]® Patient Information ©2015 [employer]. This information is not intended to replace advice given to you by your health care provider. Make sure you discuss any questions you have with your health care provider. ° °

## 2015-02-10 NOTE — ED Notes (Signed)
Pt sts RLQ pain starting today that is severe; pt is tearful

## 2015-02-12 LAB — URINE CULTURE: Colony Count: 40000

## 2015-05-31 ENCOUNTER — Other Ambulatory Visit: Payer: Self-pay | Admitting: Physician Assistant

## 2015-05-31 DIAGNOSIS — N6310 Unspecified lump in the right breast, unspecified quadrant: Principal | ICD-10-CM

## 2015-05-31 DIAGNOSIS — N6315 Unspecified lump in the right breast, overlapping quadrants: Secondary | ICD-10-CM

## 2015-06-07 ENCOUNTER — Ambulatory Visit
Admission: RE | Admit: 2015-06-07 | Discharge: 2015-06-07 | Disposition: A | Discharge status: Home / Self Care | Payer: Medicaid Other | Source: Ambulatory Visit | Attending: Physician Assistant | Admitting: Physician Assistant

## 2015-06-07 ENCOUNTER — Other Ambulatory Visit: Payer: Self-pay | Admitting: Physician Assistant

## 2015-06-07 DIAGNOSIS — N6315 Unspecified lump in the right breast, overlapping quadrants: Secondary | ICD-10-CM

## 2015-06-07 DIAGNOSIS — N6320 Unspecified lump in the left breast, unspecified quadrant: Secondary | ICD-10-CM

## 2015-06-07 DIAGNOSIS — N6310 Unspecified lump in the right breast, unspecified quadrant: Principal | ICD-10-CM

## 2016-02-10 ENCOUNTER — Other Ambulatory Visit: Payer: Self-pay | Admitting: Family Medicine

## 2016-02-10 DIAGNOSIS — N63 Unspecified lump in unspecified breast: Secondary | ICD-10-CM

## 2016-02-15 ENCOUNTER — Ambulatory Visit
Admission: RE | Admit: 2016-02-15 | Discharge: 2016-02-15 | Disposition: A | Discharge status: Home / Self Care | Payer: Medicaid Other | Source: Ambulatory Visit | Attending: Family Medicine | Admitting: Family Medicine

## 2016-02-15 DIAGNOSIS — N63 Unspecified lump in unspecified breast: Secondary | ICD-10-CM

## 2016-02-27 ENCOUNTER — Encounter (HOSPITAL_BASED_OUTPATIENT_CLINIC_OR_DEPARTMENT_OTHER): Payer: Self-pay | Admitting: Emergency Medicine

## 2016-02-27 ENCOUNTER — Emergency Department (HOSPITAL_BASED_OUTPATIENT_CLINIC_OR_DEPARTMENT_OTHER)
Admission: EM | Admit: 2016-02-27 | Discharge: 2016-02-28 | Disposition: A | Discharge status: Home / Self Care | Payer: Medicaid Other | Attending: Emergency Medicine | Admitting: Emergency Medicine

## 2016-02-27 ENCOUNTER — Emergency Department (HOSPITAL_BASED_OUTPATIENT_CLINIC_OR_DEPARTMENT_OTHER): Payer: Medicaid Other

## 2016-02-27 DIAGNOSIS — Z8701 Personal history of pneumonia (recurrent): Secondary | ICD-10-CM | POA: Insufficient documentation

## 2016-02-27 DIAGNOSIS — Z8659 Personal history of other mental and behavioral disorders: Secondary | ICD-10-CM | POA: Insufficient documentation

## 2016-02-27 DIAGNOSIS — Z79899 Other long term (current) drug therapy: Secondary | ICD-10-CM | POA: Insufficient documentation

## 2016-02-27 DIAGNOSIS — Z8639 Personal history of other endocrine, nutritional and metabolic disease: Secondary | ICD-10-CM | POA: Diagnosis not present

## 2016-02-27 DIAGNOSIS — I10 Essential (primary) hypertension: Secondary | ICD-10-CM | POA: Insufficient documentation

## 2016-02-27 DIAGNOSIS — M546 Pain in thoracic spine: Secondary | ICD-10-CM | POA: Diagnosis present

## 2016-02-27 DIAGNOSIS — Z862 Personal history of diseases of the blood and blood-forming organs and certain disorders involving the immune mechanism: Secondary | ICD-10-CM | POA: Diagnosis not present

## 2016-02-27 DIAGNOSIS — Z91040 Latex allergy status: Secondary | ICD-10-CM | POA: Insufficient documentation

## 2016-02-27 DIAGNOSIS — I959 Hypotension, unspecified: Secondary | ICD-10-CM | POA: Diagnosis not present

## 2016-02-27 DIAGNOSIS — K589 Irritable bowel syndrome without diarrhea: Secondary | ICD-10-CM | POA: Diagnosis not present

## 2016-02-27 DIAGNOSIS — G8929 Other chronic pain: Secondary | ICD-10-CM | POA: Diagnosis not present

## 2016-02-27 DIAGNOSIS — R109 Unspecified abdominal pain: Secondary | ICD-10-CM | POA: Diagnosis not present

## 2016-02-27 DIAGNOSIS — F17210 Nicotine dependence, cigarettes, uncomplicated: Secondary | ICD-10-CM | POA: Diagnosis not present

## 2016-02-27 LAB — URINALYSIS, ROUTINE W REFLEX MICROSCOPIC
Bilirubin Urine: NEGATIVE
Glucose, UA: NEGATIVE mg/dL
HGB URINE DIPSTICK: NEGATIVE
KETONES UR: NEGATIVE mg/dL
Leukocytes, UA: NEGATIVE
Nitrite: NEGATIVE
PROTEIN: NEGATIVE mg/dL
Specific Gravity, Urine: 1.008 (ref 1.005–1.030)
pH: 7 (ref 5.0–8.0)

## 2016-02-27 LAB — COMPREHENSIVE METABOLIC PANEL WITH GFR
ALT: 10 U/L — ABNORMAL LOW (ref 14–54)
AST: 17 U/L (ref 15–41)
Albumin: 3.9 g/dL (ref 3.5–5.0)
Alkaline Phosphatase: 29 U/L — ABNORMAL LOW (ref 38–126)
Anion gap: 8 (ref 5–15)
BUN: 9 mg/dL (ref 6–20)
CO2: 24 mmol/L (ref 22–32)
Calcium: 8.7 mg/dL — ABNORMAL LOW (ref 8.9–10.3)
Chloride: 109 mmol/L (ref 101–111)
Creatinine, Ser: 0.66 mg/dL (ref 0.44–1.00)
GFR calc Af Amer: >60 mL/min
GFR calc non Af Amer: >60 mL/min
Glucose, Bld: 94 mg/dL (ref 65–99)
Potassium: 3.4 mmol/L — ABNORMAL LOW (ref 3.5–5.1)
Sodium: 141 mmol/L (ref 135–145)
Total Bilirubin: 0.6 mg/dL (ref 0.3–1.2)
Total Protein: 6.6 g/dL (ref 6.5–8.1)

## 2016-02-27 LAB — CBC WITH DIFFERENTIAL/PLATELET
Basophils Absolute: 0 K/uL (ref 0.0–0.1)
Basophils Relative: 0 %
Eosinophils Absolute: 0.1 K/uL (ref 0.0–0.7)
Eosinophils Relative: 2 %
HCT: 32.5 % — ABNORMAL LOW (ref 36.0–46.0)
Hemoglobin: 11.1 g/dL — ABNORMAL LOW (ref 12.0–15.0)
Lymphocytes Relative: 49 %
Lymphs Abs: 3.3 K/uL (ref 0.7–4.0)
MCH: 29.7 pg (ref 26.0–34.0)
MCHC: 34.2 g/dL (ref 30.0–36.0)
MCV: 86.9 fL (ref 78.0–100.0)
Monocytes Absolute: 0.5 K/uL (ref 0.1–1.0)
Monocytes Relative: 8 %
Neutro Abs: 2.7 K/uL (ref 1.7–7.7)
Neutrophils Relative %: 41 %
Platelets: 190 K/uL (ref 150–400)
RBC: 3.74 MIL/uL — ABNORMAL LOW (ref 3.87–5.11)
RDW: 12.1 % (ref 11.5–15.5)
WBC: 6.6 K/uL (ref 4.0–10.5)

## 2016-02-27 LAB — LIPASE, BLOOD: Lipase: 24 U/L (ref 11–51)

## 2016-02-27 LAB — TROPONIN I: Troponin I: <0.03 ng/mL

## 2016-02-27 MED ORDER — MORPHINE SULFATE (PF) 4 MG/ML IV SOLN
4.0000 mg | Freq: Once | INTRAVENOUS | Status: AC
  Administered 2016-02-27: 4 mg via INTRAVENOUS
  Filled 2016-02-27: qty 1

## 2016-02-27 MED ORDER — SODIUM CHLORIDE 0.9 % IV BOLUS (SEPSIS)
1000.0000 mL | Freq: Once | INTRAVENOUS | Status: AC
  Administered 2016-02-27: 1000 mL via INTRAVENOUS

## 2016-02-27 MED ORDER — METOCLOPRAMIDE HCL 5 MG/ML IJ SOLN
10.0000 mg | Freq: Once | INTRAMUSCULAR | Status: AC
  Administered 2016-02-27: 10 mg via INTRAVENOUS
  Filled 2016-02-27: qty 2

## 2016-02-27 MED ORDER — IOPAMIDOL (ISOVUE-370) INJECTION 76%
100.0000 mL | Freq: Once | INTRAVENOUS | Status: AC | PRN
  Administered 2016-02-27: 100 mL via INTRAVENOUS

## 2016-02-27 NOTE — ED Notes (Signed)
Patient reports that she is having pain to her left side and flank area. The patient reports that this has been going on x 2 days. The patient reports that this the pain is to her back mostly

## 2016-02-27 NOTE — ED Provider Notes (Signed)
CSN: [id number]     Arrival date & time 02/27/16  1940 History   First MD Initiated Contact with Patient 02/27/16 2135     Chief Complaint  Patient presents with  . Flank Pain     (Consider location/radiation/quality/duration/timing/severity/associated sxs/prior Treatment) HPI  Meredith Burns is a 30 year old female with history of hypertension, anxiety who presents to the emergency department today complaining of severe back pain. Patient states that yesterday she experienced gradual onset left upper back pain. The pain is sharp in nature and progressively worsened today and is now radiating down her left arm. Patient states the pain was so severe it caused her to have several episodes of nonbloody, nonbilious emesis. Patient's husband states that when he got home he found her on the bathroom floor with vomit covering the toilet. Patient also reports left chest wall pain under her breast and pain with deep breathing. Denies lower extremity swelling, dizziness, syncope. Patient is not on exogenous estrogen. She has had a complete hysterectomy.   Past Medical History  Diagnosis Date  . Hypokalemia   . Pneumonia   . Irritable bowel syndrome (IBS)   . Gastritis   . Hypotension, postural   . Tachycardia   . Anemia   . Anxiety   . Chronic headaches   . Diverticulosis   . Hiatal hernia    Past Surgical History  Procedure Laterality Date  . Cholecystectomy    . Cesarean section      x 3    Family History  Problem Relation Age of Onset  . Colon cancer Father   . Colon polyps Father   . Heart disease Father   . Irritable bowel syndrome Mother    Social History  Substance Use Topics  . Smoking status: Current Every Day Smoker -- 0.50 packs/day for 11 years    Types: Cigarettes  . Smokeless tobacco: Never Used  . Alcohol Use: No   OB History    No data available     Review of Systems  All other systems reviewed and are negative.     Allergies  Peanut-containing  drug products; Prednisone; Aspirin; Cefaclor; Chocolate; Nasal spray; Ondansetron hcl; Iodine; and Latex  Home Medications   Prior to Admission medications   Medication Sig Start Date End Date Taking? Authorizing Provider  acetaminophen (TYLENOL) 500 MG tablet Take 1,000 mg by mouth every 8 (eight) hours as needed for mild pain or moderate pain.    Historical Provider, MD  amphetamine-dextroamphetamine (ADDERALL) 20 MG tablet Take 20 mg by mouth 5 (five) times daily.     Historical Provider, MD  dicyclomine (BENTYL) 20 MG tablet Take 1 tablet (20 mg total) by mouth 2 (two) times daily. 02/10/15   Charlesetta Shanks, MD  EPINEPHrine (EPIPEN) 0.3 mg/0.3 mL DEVI Inject 0.3 mg into the muscle once.    Historical Provider, MD  HYDROcodone-acetaminophen (NORCO) 10-325 MG per tablet Take 1 tablet by mouth every 6 (six) hours as needed for moderate pain or severe pain.    Historical Provider, MD  lubiprostone (AMITIZA) 24 MCG capsule Take 1 capsule (24 mcg total) by mouth 2 (two) times daily with a meal. Patient not taking: Reported on 02/10/2015 10/01/13   Janett Billow D Zehr, PA-C  metoCLOPramide (REGLAN) 10 MG tablet Take 1 tablet (10 mg total) by mouth every 6 (six) hours as needed for nausea or vomiting (nausea/headache). 02/10/15   Charlesetta Shanks, MD  promethazine (PHENERGAN) 12.5 MG suppository Place 1 suppository (12.5 mg total) rectally every  6 (six) hours as needed for nausea or vomiting. Patient not taking: Reported on 02/10/2015 10/01/13   Laban Emperor Zehr, PA-C  promethazine (PHENERGAN) 25 MG tablet Take 1 tablet (25 mg total) by mouth every 6 (six) hours as needed for nausea. 08/31/11 09/07/11  Teressa Lower, MD  ranitidine (ZANTAC) 150 MG tablet Take 150 mg by mouth once as needed. For indigestion     Historical Provider, MD   BP 93/64 mmHg  Pulse 71  Temp(Src) 98.1 F (36.7 C) (Oral)  Resp 18  Ht 5\' 6"  (1.676 m)  Wt 64.864 kg  BMI 23.09 kg/m2  SpO2 100%  LMP 11/14/2013 Physical Exam    Constitutional: She is oriented to person, place, and time. She appears well-developed and well-nourished. She appears distressed.  Patient tearful on exam  HENT:  Head: Normocephalic and atraumatic.  Mouth/Throat: No oropharyngeal exudate.  Eyes: Conjunctivae and EOM are normal. Pupils are equal, round, and reactive to light. Right eye exhibits no discharge. Left eye exhibits no discharge. No scleral icterus.  Neck: Neck supple. No JVD present.  Cardiovascular: Normal rate, regular rhythm, normal heart sounds and intact distal pulses.  Exam reveals no gallop and no friction rub.   No murmur heard. Pulmonary/Chest: Effort normal and breath sounds normal. No respiratory distress. She has no wheezes. She has no rales. She exhibits tenderness.    Abdominal: Soft. She exhibits no distension. There is no tenderness. There is no guarding.  Musculoskeletal: Normal range of motion. She exhibits no edema.       Back:  No midline spinal tenderness. F ROMI of seizure, T, L-spine. No step-offs or obvious deformities.  Lymphadenopathy:    She has no cervical adenopathy.  Neurological: She is alert and oriented to person, place, and time.  Strength 5/5 throughout. No sensory deficits.    Skin: Skin is warm and dry. No rash noted. She is not diaphoretic. No erythema. No pallor.  Psychiatric: She has a normal mood and affect. Her behavior is normal.  Nursing note and vitals reviewed.   ED Course  Procedures (including critical care time) Labs Review Labs Reviewed  COMPREHENSIVE METABOLIC PANEL - Abnormal; Notable for the following:    Potassium 3.4 (*)    Calcium 8.7 (*)    ALT 10 (*)    Alkaline Phosphatase 29 (*)    All other components within normal limits  CBC WITH DIFFERENTIAL/PLATELET - Abnormal; Notable for the following:    RBC 3.74 (*)    Hemoglobin 11.1 (*)    HCT 32.5 (*)    All other components within normal limits  URINALYSIS, ROUTINE W REFLEX MICROSCOPIC (NOT AT [facility])   LIPASE, BLOOD  TROPONIN I    Imaging Review No results found. I have personally reviewed and evaluated these images and lab results as part of my medical decision-making.   EKG Interpretation   Date/Time:  Monday February 27 2016 22:50:44 EDT Ventricular Rate:  58 PR Interval:  146 QRS Duration: 104 QT Interval:  434 QTC Calculation: 426 R Axis:   94 Text Interpretation:  Sinus rhythm Borderline right axis deviation ED  PHYSICIAN INTERPRETATION AVAILABLE IN [facility] Confirmed by TEST,  Record ([id number]) on 02/28/2016 6:51:31 AM      MDM   Final diagnoses:  Left-sided thoracic back pain  Hypotension, unspecified hypotension type     30 year old female presents with acute onset left upper back pain radiating down her left arm and into her left chest wall. Pain is worse with  deep inspiration and with palpation. Patient associated several episodes of vomiting today due to pain. Has been found her on the bathroom floor with a toilet covered in vomit. On presentation to ED, patient is very tearful and appears to be in a lot of pain. Blood pressure is low 86/50. This is patient's baseline, she has chronic hypotension. No dizziness or diaphoresis. Patient given IV fluids and pain medication. After morphine administration patient became very flushed and itchy. Patient was given Benadryl with improvement in her symptoms. Morphine was added to patient's allergy list. Blood pressures are moderately different on each arm, increased in the left side. We'll obtain CT aorta to rule out dissection. No hypoxia or tachycardia. Patient has PE RC negative. Doubt PE. EKG is unremarkable. Initial troponin negative. Low suspicion ACS. CT is negative for dissection or pulmonary embolism. Patient's pain is much improved. Suspect pain is muscular skeletal in etiology. Patient is symptomatically stable and ready for discharge. She will follow-up with her primary care provider as an outpatient. Return  precautions outlined in patient discharge instructions.   Patient was discussed with and seen by Dr. Colvin Caroli who agrees with the treatment plan.     Dondra Spry Dunbar, PA-C 03/01/16 [id number]  Charlesetta Shanks, MD 03/02/16 1539

## 2016-02-28 MED ORDER — DIPHENHYDRAMINE HCL 50 MG/ML IJ SOLN
INTRAMUSCULAR | Status: AC
Start: 2016-02-28 — End: 2016-02-28
  Administered 2016-02-28: 25 mg
  Filled 2016-02-28: qty 1

## 2016-02-28 MED ORDER — CYCLOBENZAPRINE HCL 10 MG PO TABS: 10.0000 mg | ORAL_TABLET | Freq: Two times a day (BID) | ORAL | Status: DC | PRN

## 2016-02-28 MED ORDER — HYDROCODONE-ACETAMINOPHEN 5-325 MG PO TABS: 2.0000 | ORAL_TABLET | ORAL | Status: DC | PRN

## 2016-02-28 NOTE — Discharge Instructions (Signed)
Back Pain, Adult Back pain is very common in adults.The cause of back pain is rarely dangerous and the pain often gets better over time.The cause of your back pain may not be known. Some common causes of back pain include:  Strain of the muscles or ligaments supporting the spine.  Wear and tear (degeneration) of the spinal disks.  Arthritis.  Direct injury to the back. For many people, back pain may return. Since back pain is rarely dangerous, most people can learn to manage this condition on their own. HOME CARE INSTRUCTIONS Watch your back pain for any changes. The following actions may help to lessen any discomfort you are feeling:  Remain active. It is stressful on your back to sit or stand in one place for long periods of time. Do not sit, drive, or stand in one place for more than 30 minutes at a time. Take short walks on even surfaces as soon as you are able.Try to increase the length of time you walk each day.  Exercise regularly as directed by your health care provider. Exercise helps your back heal faster. It also helps avoid future injury by keeping your muscles strong and flexible.  Do not stay in bed.Resting more than 1-2 days can delay your recovery.  Pay attention to your body when you bend and lift. The most comfortable positions are those that put less stress on your recovering back. Always use proper lifting techniques, including:  Bending your knees.  Keeping the load close to your body.  Avoiding twisting.  Find a comfortable position to sleep. Use a firm mattress and lie on your side with your knees slightly bent. If you lie on your back, put a pillow under your knees.  Avoid feeling anxious or stressed.Stress increases muscle tension and can worsen back pain.It is important to recognize when you are anxious or stressed and learn ways to manage it, such as with exercise.  Take medicines only as directed by your health care provider. Over-the-counter  medicines to reduce pain and inflammation are often the most helpful.Your health care provider may prescribe muscle relaxant drugs.These medicines help dull your pain so you can more quickly return to your normal activities and healthy exercise.  Apply ice to the injured area:  Put ice in a plastic bag.  Place a towel between your skin and the bag.  Leave the ice on for 20 minutes, 2-3 times a day for the first 2-3 days. After that, ice and heat may be alternated to reduce pain and spasms.  Maintain a healthy weight. Excess weight puts extra stress on your back and makes it difficult to maintain good posture. SEEK MEDICAL CARE IF:  You have pain that is not relieved with rest or medicine.  You have increasing pain going down into the legs or buttocks.  You have pain that does not improve in one week.  You have night pain.  You lose weight.  You have a fever or chills. SEEK IMMEDIATE MEDICAL CARE IF:   You develop new bowel or bladder control problems.  You have unusual weakness or numbness in your arms or legs.  You develop nausea or vomiting.  You develop abdominal pain.  You feel faint.   This information is not intended to replace advice given to you by your health care provider. Make sure you discuss any questions you have with your health care provider.  Follow up with your primary care provider for re-evaluation. Take pain medication and muscle relaxers as  needed. Apply ice to affected area. Return to the ED if you experience severe increase in your pain, shortness of breath, loss of consciousness, dizziness, fever, weakness.

## 2016-02-28 NOTE — ED Notes (Addendum)
PA at bedside discussing test results and dispo plan of care. 

## 2016-04-16 ENCOUNTER — Ambulatory Visit: Payer: Medicaid Other | Admitting: Internal Medicine

## 2017-02-18 ENCOUNTER — Other Ambulatory Visit: Payer: Self-pay | Admitting: Family Medicine

## 2017-02-18 DIAGNOSIS — N6002 Solitary cyst of left breast: Secondary | ICD-10-CM

## 2017-02-28 ENCOUNTER — Ambulatory Visit
Admission: RE | Admit: 2017-02-28 | Discharge: 2017-02-28 | Disposition: A | Discharge status: Home / Self Care | Payer: Medicaid Other | Source: Ambulatory Visit | Attending: Family Medicine | Admitting: Family Medicine

## 2017-02-28 DIAGNOSIS — N6002 Solitary cyst of left breast: Secondary | ICD-10-CM

## 2017-06-13 ENCOUNTER — Encounter: Payer: Self-pay | Admitting: Physician Assistant

## 2017-06-13 ENCOUNTER — Telehealth: Payer: Self-pay | Admitting: Emergency Medicine

## 2017-06-13 ENCOUNTER — Ambulatory Visit (INDEPENDENT_AMBULATORY_CARE_PROVIDER_SITE_OTHER): Payer: Medicaid Other | Admitting: Physician Assistant

## 2017-06-13 ENCOUNTER — Other Ambulatory Visit (INDEPENDENT_AMBULATORY_CARE_PROVIDER_SITE_OTHER): Payer: Medicaid Other

## 2017-06-13 VITALS — BP 120/58 | HR 80 | Ht 65.0 in | Wt 135.0 lb

## 2017-06-13 DIAGNOSIS — R12 Heartburn: Secondary | ICD-10-CM | POA: Diagnosis not present

## 2017-06-13 DIAGNOSIS — R1030 Lower abdominal pain, unspecified: Secondary | ICD-10-CM

## 2017-06-13 DIAGNOSIS — R1013 Epigastric pain: Secondary | ICD-10-CM

## 2017-06-13 DIAGNOSIS — R3911 Hesitancy of micturition: Secondary | ICD-10-CM | POA: Diagnosis not present

## 2017-06-13 DIAGNOSIS — K5900 Constipation, unspecified: Secondary | ICD-10-CM

## 2017-06-13 LAB — URINALYSIS, ROUTINE W REFLEX MICROSCOPIC
Bilirubin Urine: NEGATIVE
Hgb urine dipstick: NEGATIVE
Ketones, ur: NEGATIVE
Leukocytes, UA: NEGATIVE
Nitrite: NEGATIVE
RBC / HPF: NONE SEEN
Specific Gravity, Urine: <=1.005 — AB
Total Protein, Urine: NEGATIVE
Urine Glucose: NEGATIVE
Urobilinogen, UA: 0.2
pH: 7 (ref 5.0–8.0)

## 2017-06-13 MED ORDER — LINACLOTIDE 290 MCG PO CAPS: 290.0000 ug | ORAL_CAPSULE | Freq: Every day | ORAL | 3 refills | Status: DC

## 2017-06-13 MED ORDER — RANITIDINE HCL 150 MG PO TABS: 150.0000 mg | ORAL_TABLET | Freq: Every day | ORAL | 2 refills | Status: DC

## 2017-06-13 NOTE — Patient Instructions (Addendum)
Your physician has requested that you go to the basement for the following lab work before leaving today: Urinalysis  We have given you a low fodmap diet, please try to adhere to this diet.   We have sent the following medications to your pharmacy for you to pick up at your convenience: Zantac 150 mg daily Linzess 290 mcg daily  Continue Miralax daily for now, then discontinue if needed.   You have been scheduled for a gastric emptying scan at [facility] on 06/21/17 at 7:30 am. Please arrive at least 15 minutes prior to your appointment for registration. Please make certain not to have anything to eat or drink after midnight the night before your test. Hold all stomach medications (ex: Zofran, phenergan, Reglan) 48 hours prior to your test. If you need to reschedule your appointment, please contact radiology scheduling at [phone]. _____________________________________________________________________ A gastric-emptying study measures how long it takes for food to move through your stomach. There are several ways to measure stomach emptying. In the most common test, you eat food that contains a small amount of radioactive material. A scanner that detects the movement of the radioactive material is placed over your abdomen to monitor the rate at which food leaves your stomach. This test normally takes about 4 hours to complete. _____________________________________________________________________

## 2017-06-13 NOTE — Progress Notes (Signed)
Reviewed

## 2017-06-13 NOTE — Progress Notes (Addendum)
Chief Complaint: Constipation, generalized abdominal cramping, heartburn  HPI:  Meredith Burns is a 31 year old Caucasian female with a past medical history of anxiety, IBS and others listed below, who presents to clinic today as a new patient for complaint of constipation, generalized abdominal cramping and heartburn.   Please recall patient has previously seen Dr. Henrene Pastor.  She did have an upper endoscopy and colonoscopy 10/27/13 which were both normal. 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 A gastric emptying study was recommended but she never had this done.   Today, the patient presents to clinic accompanied by her mother and explains that "everything has gotten worse". Currently, the patient describes that she has chronic lower abdominal cramping pain which seems to radiate up into her epigastrium over the past 3 months. She tells me that she will get epigastric cramping which has led to a decrease in her appetite and whenever she eats anything this is worse. She also describes occasional "burning". Patient is using her Zantac 150 mg just as needed. She is also using Phenergan 25 mg as needed for some nausea. She is no longer using Bentyl or Reglan.   Patient also continues to complain of chronic constipation noting that "when I go just looks like rabbit pellets, and it "smells bad". Patient was on Amitiza 24 mcg twice a day and tried this for 4-5 months but this was not helping so she discontinued it. She has continued on MiraLAX once daily. She tells me she tried to increase this to more doses but she "vomited". Associated symptoms included bloating and a feeling of fullness.   Patient also complains today that she feels as though when she urinates she will have only a little bit of pee but feel like her bladder is still full. This has been occurring over the past few months.No blood in her urine or pain with urination.   Patient denies fever, chills, blood in her stool, melena, weight loss,  vomiting, heartburn, reflux or dysphagia.  Past Medical History:  Diagnosis Date  . Anemia   . Anxiety   . Chronic headaches   . Diverticulosis   . Gastritis   . Hiatal hernia   . Hypokalemia   . Hypotension, postural   . IBS (irritable bowel syndrome)   . Irritable bowel syndrome (IBS)   . Pneumonia   . Tachycardia     Past Surgical History:  Procedure Laterality Date  . CESAREAN SECTION     x 3   . CHOLECYSTECTOMY      Current Outpatient Prescriptions  Medication Sig Dispense Refill  . acetaminophen (TYLENOL) 500 MG tablet Take 1,000 mg by mouth every 8 (eight) hours as needed for mild pain or moderate pain.    Marland Kitchen amphetamine-dextroamphetamine (ADDERALL) 20 MG tablet Take 20 mg by mouth 5 (five) times daily.     . cyclobenzaprine (FLEXERIL) 10 MG tablet Take 1 tablet (10 mg total) by mouth 2 (two) times daily as needed for muscle spasms. 20 tablet 0  . dicyclomine (BENTYL) 20 MG tablet Take 1 tablet (20 mg total) by mouth 2 (two) times daily. 20 tablet 0  . EPINEPHrine (EPIPEN) 0.3 mg/0.3 mL DEVI Inject 0.3 mg into the muscle once.    Marland Kitchen HYDROcodone-acetaminophen (NORCO/VICODIN) 5-325 MG tablet Take 2 tablets by mouth every 4 (four) hours as needed. 6 tablet 0  . lubiprostone (AMITIZA) 24 MCG capsule Take 1 capsule (24 mcg total) by mouth 2 (two) times daily with a meal. 60 capsule 3  .  metoCLOPramide (REGLAN) 10 MG tablet Take 1 tablet (10 mg total) by mouth every 6 (six) hours as needed for nausea or vomiting (nausea/headache). 20 tablet 0  . promethazine (PHENERGAN) 12.5 MG suppository Place 1 suppository (12.5 mg total) rectally every 6 (six) hours as needed for nausea or vomiting. 12 each 0  . ranitidine (ZANTAC) 150 MG tablet Take 150 mg by mouth once as needed. For indigestion     . HYDROcodone-acetaminophen (NORCO) 10-325 MG per tablet Take 1 tablet by mouth every 6 (six) hours as needed for moderate pain or severe pain.    . promethazine (PHENERGAN) 25 MG tablet Take 1  tablet (25 mg total) by mouth every 6 (six) hours as needed for nausea. 30 tablet 0   No current facility-administered medications for this visit.     Allergies as of 06/13/2017 - Review Complete 06/13/2017  Allergen Reaction Noted  . Peanut-containing drug products Anaphylaxis 08/30/2011  . Prednisone Anaphylaxis 02/10/2015  . Aspirin Swelling   . Bee venom  06/13/2017  . Cefaclor Diarrhea and Nausea And Vomiting   . Chocolate Swelling 08/30/2011  . Morphine and related Hives 02/28/2016  . Nasal spray Swelling 08/30/2011  . Ondansetron hcl Other (See Comments)   . Iodine Rash 07/10/2012  . Latex Rash     Family History  Problem Relation Age of Onset  . Colon cancer Father   . Colon polyps Father   . Heart disease Father   . Irritable bowel syndrome Mother     Social History   Social History  . Marital status: Married    Spouse name: N/A  . Number of children: 3  . Years of education: N/A   Occupational History  . stay at home mom    Social History Main Topics  . Smoking status: Current Every Day Smoker    Packs/day: 0.50    Years: 11.00    Types: Cigarettes  . Smokeless tobacco: Never Used  . Alcohol use Yes     Comment: socially  . Drug use: No  . Sexual activity: Yes    Birth control/ protection: Other-see comments   Other Topics Concern  . Not on file   Social History Narrative  . No narrative on file    Review of Systems:    Constitutional: No weight loss, fever or chills Skin: No rash Cardiovascular: No chest pain  Respiratory: No SOB  Gastrointestinal: See HPI and otherwise negative Genitourinary: Incomplete urination Neurological: No headache, dizziness or syncope Musculoskeletal: No new muscle or joint pain Hematologic: No bleeding  Psychiatric: Positive history of anxiety   Physical Exam:  Vital signs: BP (!) 120/58   Pulse 80   Ht 5\' 5"  (1.651 m)   Wt 135 lb (61.2 kg)   LMP 11/14/2013   BMI 22.47 kg/m   Constitutional:    Pleasant Caucasian female appears to be in NAD, Well developed, Well nourished, alert and cooperative Head:  Normocephalic and atraumatic. Eyes:   PEERL, EOMI. No icterus. Conjunctiva pink. Ears:  Normal auditory acuity. Neck:  Supple Throat: Oral cavity and pharynx without inflammation, swelling or lesion.  Respiratory: Respirations even and unlabored. Lungs clear to auscultation bilaterally.   No wheezes, crackles, or rhonchi.  Cardiovascular: Normal S1, S2. No MRG. Regular rate and rhythm. No peripheral edema, cyanosis or pallor.  Gastrointestinal:  Soft, nondistended, Moderate Generalized abdominal ttp No rebound or guarding. Normal bowel sounds. No appreciable masses or hepatomegaly. Rectal:  Not performed.  Msk:  Symmetrical without gross  deformities. Without edema, no deformity or joint abnormality.  Neurologic:  Alert and  oriented x4;  grossly normal neurologically.  Skin:   Dry and intact without significant lesions or rashes. Psychiatric: Demonstrates good judgement and reason without abnormal affect or behaviors.  No recent labs or imaging.  Assessment: 1. Constipation: Chronic for the patient, currently on MiraLAX once daily but continues with " rabbit pellets", cannot increase MiraLAX due to vomiting, Amitiza 24 has not worked twice daily in the past (trial for 4 months), colonoscopy last in 2014 normal; consider relation to possible gastroparesis vs IBS-C 2. Epigastric cramping: Somewhat new for the patient, some burning pain at times, no reflux, no relief with chewable antacids, last EGD 2014 normal; consider functional versus gastroparesis versus other 3. Heartburn: See above 4. Lower abdominal cramping: Likely related to constipation and IBS 5. Incomplete urination: Nw for the patient, describes feeling as though she only "pees a little and has more that she can get out"; Consider UTI versus other  Plan: 1. Requesting recent labs from PCP, [facility] in [place] 2. Started patient on Linzess 290 mcg daily, provided samples and prescription for 90 days 3. Patient to continue her MiraLAX once daily for a few days until Linzess starts to work 4. Started Hyoscyamine sulfate 0.125 mg twice daily, 30 minutes before breakfast and dinner 5. Ordered gastric emptying study per recommendations from Dr. Henrene Pastor after time of last endoscopy 6. Patient to use her Zantac 150 mg once daily. Provided new prescription #90 with 1 refill 7. Ordered a urinalysis due to patient's feeling of incomplete urination 8. Patient to return to clinic in 1-2 months with Dr. Henrene Pastor or myself  Ellouise Newer, PA-C [facility] 06/13/2017, 9:09 AM  Cc: Chesley Noon, MD   Addendum: 06/14/17 1317 Procedure patient's most recent labs from 02/07/17 including urinalysis at that time which showed some blood in patient's urine as well as crystals which were thought concerning for an increased chance of kidney stones in the future. Patient also had a lipid panel which was normal, CMP and CBC which were normal.  No change to recommendations.  Ellouise Newer, PA-C

## 2017-06-13 NOTE — Telephone Encounter (Signed)
Received fax from [facility] that [facility] has been rejected. Called [facility] to submit prior auth and they stated that Linzess is preferred it just needs to be 30 day supply and not 90. Patient informed and Rx changed.

## 2017-06-14 LAB — URINE CULTURE: ORGANISM ID, BACTERIA: NO GROWTH

## 2017-06-19 ENCOUNTER — Telehealth: Payer: Self-pay | Admitting: Physician Assistant

## 2017-06-19 NOTE — Telephone Encounter (Signed)
The pt has been advised and will make her own appt at urology   Notes recorded by Levin Erp, PA on 06/13/2017 at 11:20 AM EDT No sign of UTI- We can offer her referral to urology if she wishes to discuss urinary symptom further. JLL

## 2017-06-21 ENCOUNTER — Ambulatory Visit (HOSPITAL_COMMUNITY)
Admission: RE | Admit: 2017-06-21 | Discharge: 2017-06-21 | Disposition: A | Discharge status: Home / Self Care | Payer: Medicaid Other | Source: Ambulatory Visit | Attending: Physician Assistant | Admitting: Physician Assistant

## 2017-06-21 DIAGNOSIS — K5900 Constipation, unspecified: Secondary | ICD-10-CM | POA: Diagnosis not present

## 2017-06-21 DIAGNOSIS — R1013 Epigastric pain: Secondary | ICD-10-CM

## 2017-06-21 DIAGNOSIS — R1030 Lower abdominal pain, unspecified: Secondary | ICD-10-CM

## 2017-06-21 DIAGNOSIS — R12 Heartburn: Secondary | ICD-10-CM

## 2017-06-21 MED ORDER — TECHNETIUM TC 99M SULFUR COLLOID
1.9900 | Freq: Once | INTRAVENOUS | Status: AC | PRN
  Administered 2017-06-21: 1.99 via INTRAVENOUS

## 2017-08-08 ENCOUNTER — Ambulatory Visit: Payer: Medicaid Other | Admitting: Internal Medicine

## 2017-11-26 DIAGNOSIS — C801 Malignant (primary) neoplasm, unspecified: Secondary | ICD-10-CM

## 2017-11-26 HISTORY — DX: Malignant (primary) neoplasm, unspecified: C80.1

## 2017-12-03 DIAGNOSIS — Z1501 Genetic susceptibility to malignant neoplasm of breast: Secondary | ICD-10-CM | POA: Insufficient documentation

## 2017-12-25 DIAGNOSIS — N6002 Solitary cyst of left breast: Secondary | ICD-10-CM | POA: Insufficient documentation

## 2018-04-15 DIAGNOSIS — N6489 Other specified disorders of breast: Secondary | ICD-10-CM | POA: Insufficient documentation

## 2018-10-28 DIAGNOSIS — Z806 Family history of leukemia: Secondary | ICD-10-CM | POA: Insufficient documentation

## 2018-10-28 DIAGNOSIS — F331 Major depressive disorder, recurrent, moderate: Secondary | ICD-10-CM | POA: Insufficient documentation

## 2018-10-28 DIAGNOSIS — F411 Generalized anxiety disorder: Secondary | ICD-10-CM | POA: Insufficient documentation

## 2018-10-28 DIAGNOSIS — F902 Attention-deficit hyperactivity disorder, combined type: Secondary | ICD-10-CM | POA: Insufficient documentation

## 2018-12-10 ENCOUNTER — Encounter: Payer: Self-pay | Admitting: Internal Medicine

## 2018-12-10 ENCOUNTER — Encounter: Payer: Self-pay | Admitting: Physician Assistant

## 2018-12-10 ENCOUNTER — Ambulatory Visit: Payer: Medicaid Other | Admitting: Physician Assistant

## 2018-12-10 VITALS — BP 100/66 | HR 84 | Ht 65.0 in | Wt 145.8 lb

## 2018-12-10 DIAGNOSIS — R1084 Generalized abdominal pain: Secondary | ICD-10-CM

## 2018-12-10 DIAGNOSIS — Z800 Family history of malignant neoplasm of digestive organs: Secondary | ICD-10-CM

## 2018-12-10 DIAGNOSIS — K5900 Constipation, unspecified: Secondary | ICD-10-CM | POA: Diagnosis not present

## 2018-12-10 DIAGNOSIS — K219 Gastro-esophageal reflux disease without esophagitis: Secondary | ICD-10-CM | POA: Diagnosis not present

## 2018-12-10 DIAGNOSIS — R1110 Vomiting, unspecified: Secondary | ICD-10-CM

## 2018-12-10 MED ORDER — PANTOPRAZOLE SODIUM 40 MG PO TBEC: DELAYED_RELEASE_TABLET | ORAL | 3 refills | Status: DC

## 2018-12-10 MED ORDER — NA SULFATE-K SULFATE-MG SULF 17.5-3.13-1.6 GM/177ML PO SOLN: 1.0000 | Freq: Once | ORAL | 0 refills | Status: AC

## 2018-12-10 MED ORDER — METOCLOPRAMIDE HCL 10 MG PO TABS: ORAL_TABLET | ORAL | 0 refills | Status: DC

## 2018-12-10 NOTE — Progress Notes (Signed)
Assessment and plan reviewed 

## 2018-12-10 NOTE — Patient Instructions (Signed)
We have sent the following medications to your pharmacy for you to pick up at your convenience:  Pantoprazole, Reglan  Take 1 Reglan about 20 minutes before drinking each half of the prep.  You have been scheduled for an endoscopy and colonoscopy. Please follow the written instructions given to you at your visit today. Please pick up your prep supplies at the pharmacy within the next 1-3 days. If you use inhalers (even only as needed), please bring them with you on the day of your procedure. Your physician has requested that you go to www.startemmi.com and enter the access code given to you at your visit today. This web site gives a general overview about your procedure. However, you should still follow specific instructions given to you by our office regarding your preparation for the procedure.

## 2018-12-10 NOTE — Progress Notes (Signed)
Chief Complaint: Abdominal pain, vomiting, constipation, family history of colon cancer, GERD  HPI:    Meredith Burns is a 33 year old Caucasian female with a past medical history as listed below including IBS, known to Dr. Henrene Pastor, who presents to clinic today with a complaint of abdominal pain, vomiting, constipation, family history of colon cancer and GERD.    06/13/2017 office visit with me to discuss constipation, generalized abdominal cramping and heartburn.  Her previous EGD and colonoscopy 10/27/2013 were discussed which were both normal.  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  Gastric emptying study has been recommended but she had never done this.  At that time patient was accompanied by her mother who explained that everything has gotten worse.  She had chronic lower abdominal cramping which radiated up into her epigastrium as well as epigastric cramping.  Was using Zantac as needed as well as Phenergan, also described chronic constipation.  Was on Amitiza 24 mcg twice a day for 4 to 5 months without is not helping so she discontinued it and was using MiraLAX.  Also describes some vomiting.  At that time started Linzess 290 mcg daily and told her to continue MiraLAX while initiating Linzess.  Started Hyoscyamine twice daily, order gastric emptying study and recommend she use Zantac once daily.  Patient was also referred to urology given complaint of incomplete urination.    06/21/2017 gastric emptying study was normal.    10/28/2018 patient having eval for dysuria by PCP.  At that time described history of recent breast cancer and partial right-sided mastectomy.  Son also recently diagnosed with leukemia.  Was using her Xanax more than prescribed for increased anxiety.  Prescribed Augmentin at that time for sinusitis.  Given Klonopin and Zoloft for generalized anxiety disorder.  Prescribed Nystatin for suspected thrush.    10/28/2018 CBC normal, CBC normal.    Today, patient presents to clinic  again with her mother who does help with her history.  Explains that over the past year or so all of her symptoms have seemed to get worse.  Tells me that everything we tried at last visit including Linzess and antispasmodics did not help.  Currently not on any GI medications.  Tells me that she has terrible stomach cramps worse in her right upper quadrant but do radiate everywhere which occur multiple times a day anytime she eats, lettuce seems to make it worse.  These are so bad they are rated as a 10-11/10 and bend her over.  Often has to just lay with a heating pad which "does not help anyways".      Continues with constipation describing rabbit pellets.  Currently, using a stool softener once daily over the past month and a half.  Tells me that if she does not "poop then I vomit".  Also worsens the abdominal cramping.  She has been vomiting so much that she feels as though her taste buds are off some.  Describes a weight gain of 15 pounds over the past few months and tells me that this must be from all of her "shit".    Also describes spitting up a brown-like material after eating and some hoarseness.    She and her mother describe they are very concerned about cancer.  Apparently her father was diagnosed with colon cancer at 55 and she was advised to have a colonoscopy every 5 years.  Also with new diagnosis of breast cancer she is advised to get another colonoscopy.  Apparently dad  just had some new finding in his stomach as well and they really want to have EGD.    Medical history significant for a large amount of anxiety.    Denies fever, chills, weight loss, blood in her stool or symptoms that awaken her from sleep.  Past Medical History:  Diagnosis Date  . Anemia   . Anxiety   . Chronic headaches   . Diverticulosis   . Gastritis   . Hiatal hernia   . Hypokalemia   . Hypotension, postural   . IBS (irritable bowel syndrome)   . Irritable bowel syndrome (IBS)   . Pneumonia   . Tachycardia      Past Surgical History:  Procedure Laterality Date  . CESAREAN SECTION     x 3   . CHOLECYSTECTOMY      Current Outpatient Medications  Medication Sig Dispense Refill  . acetaminophen (TYLENOL) 500 MG tablet Take 1,000 mg by mouth every 8 (eight) hours as needed for mild pain or moderate pain.    Marland Kitchen amphetamine-dextroamphetamine (ADDERALL) 20 MG tablet Take 20 mg by mouth 5 (five) times daily.     . cyclobenzaprine (FLEXERIL) 10 MG tablet Take 1 tablet (10 mg total) by mouth 2 (two) times daily as needed for muscle spasms. 20 tablet 0  . EPINEPHrine (EPIPEN) 0.3 mg/0.3 mL DEVI Inject 0.3 mg into the muscle once.    . promethazine (PHENERGAN) 25 MG tablet Take 1 tablet (25 mg total) by mouth every 6 (six) hours as needed for nausea. 30 tablet 0   No current facility-administered medications for this visit.     Allergies as of 12/10/2018 - Review Complete 12/10/2018  Allergen Reaction Noted  . Peanut-containing drug products Anaphylaxis 08/30/2011  . Prednisone Anaphylaxis 02/10/2015  . Aspirin Swelling   . Bee venom  06/13/2017  . Cefaclor Diarrhea and Nausea And Vomiting   . Chocolate Swelling 08/30/2011  . Morphine and related Hives 02/28/2016  . Nasal spray Swelling 08/30/2011  . Ondansetron hcl Other (See Comments)   . Iodine Rash 07/10/2012  . Latex Rash     Family History  Problem Relation Age of Onset  . Colon cancer Father   . Colon polyps Father   . Heart disease Father   . Irritable bowel syndrome Mother     Social History   Socioeconomic History  . Marital status: Married    Spouse name: Not on file  . Number of children: 3  . Years of education: Not on file  . Highest education level: Not on file  Occupational History  . Occupation: stay at home mom  Social Needs  . Financial resource strain: Not on file  . Food insecurity:    Worry: Not on file    Inability: Not on file  . Transportation needs:    Medical: Not on file    Non-medical: Not on  file  Tobacco Use  . Smoking status: Current Every Day Smoker    Packs/day: 0.50    Years: 11.00    Pack years: 5.50    Types: Cigarettes  . Smokeless tobacco: Never Used  Substance and Sexual Activity  . Alcohol use: Yes    Comment: socially  . Drug use: No  . Sexual activity: Yes    Birth control/protection: Other-see comments  Lifestyle  . Physical activity:    Days per week: Not on file    Minutes per session: Not on file  . Stress: Not on file  Relationships  .  Social connections:    Talks on phone: Not on file    Gets together: Not on file    Attends religious service: Not on file    Active member of club or organization: Not on file    Attends meetings of clubs or organizations: Not on file    Relationship status: Not on file  . Intimate partner violence:    Fear of current or ex partner: Not on file    Emotionally abused: Not on file    Physically abused: Not on file    Forced sexual activity: Not on file  Other Topics Concern  . Not on file  Social History Narrative  . Not on file    Review of Systems:    Constitutional: No weight loss, fever or chills Cardiovascular: No chest pain Respiratory: No SOB Gastrointestinal: See HPI and otherwise negative Psychiatric: history of anxiety   Physical Exam:  Vital signs: BP 100/66   Pulse 84   Ht '5\' 5"'  (1.651 m)   Wt 145 lb 12.8 oz (66.1 kg)   LMP 11/14/2013   BMI 24.26 kg/m    Constitutional: Anxious Caucasian female appears to be in NAD, Well developed, Well nourished, alert and cooperative Head:  Normocephalic and atraumatic. Eyes:   PEERL, EOMI. No icterus. Conjunctiva pink. Ears:  Normal auditory acuity. Neck:  Supple Throat: Oral cavity and pharynx without inflammation, swelling or lesion.  Respiratory: Respirations even and unlabored. Lungs clear to auscultation bilaterally.   No wheezes, crackles, or rhonchi.  Cardiovascular: Normal S1, S2. No MRG. Regular rate and rhythm. No peripheral edema,  cyanosis or pallor.  Gastrointestinal:  Soft, nondistended, generalized marked ttp to only light palpation ( of note, no ttp with stethoscope palpation). Minimally decreased BS in all 4 quads. No appreciable masses or hepatomegaly. Rectal:  Not performed.  Msk:  Symmetrical without gross deformities. Without edema, no deformity or joint abnormality.  Neurologic:  Alert and  oriented x4;  grossly normal neurologically.  Skin:   Dry and intact without significant lesions or rashes. Psychiatric: Demonstrates good judgement and reason without abnormal affect or behaviors.  No recent labs or imaging.  Assessment: 1.  Constipation: Chronic for the patient, thought related to IBS-seen in the past, per patient no relief with Amitiza or Linzess in the past, MiraLAX or stool softeners, unsure how diligent she is with this medication 2.  Generalized abdominal cramping: With IBS, no help from antispasmodics in the past, though again unsure how diligent she is with this medication 3.  Vomiting: With all the above  4.  GERD: Not currently on medication 5.  Family history of colon cancer: In her father at 1, patient's last colonoscopy 6 years ago  78.  Personal history of BRCA gene and recent partial mastectomy for breast cancer  Plan: 1.  Scheduled patient for an EGD and colonoscopy in the [facility] with Dr. Henrene Pastor.  Did discuss risks, benefits, limitations and alternatives and the patient agrees to proceed. 2.  Prescribed Pantoprazole 40 mg daily, 30-60 minutes before breakfast.  #30 with 3 refills 3.  Patient will likely need a better laxative regimen after time of colonoscopy, hopefully prep will be somewhat therapeutic.  She has tried Linzess and Amitiza in the past which she tells me made her sick, may try Linzess low-dose 72 mcg daily. 4.  Patient's anxiety definitely plays a role in her symptoms which are mostly functional. 5.  Patient to follow in clinic with Dr. Henrene Pastor per recommendations after time  of  procedures.  Ellouise Newer, PA-C [facility] 12/10/2018, 11:08 AM  Cc: Chesley Noon, MD

## 2018-12-16 ENCOUNTER — Ambulatory Visit (AMBULATORY_SURGERY_CENTER): Payer: BLUE CROSS/BLUE SHIELD | Admitting: Internal Medicine

## 2018-12-16 ENCOUNTER — Other Ambulatory Visit (INDEPENDENT_AMBULATORY_CARE_PROVIDER_SITE_OTHER): Payer: BLUE CROSS/BLUE SHIELD

## 2018-12-16 ENCOUNTER — Encounter: Payer: Self-pay | Admitting: Internal Medicine

## 2018-12-16 ENCOUNTER — Other Ambulatory Visit: Payer: Self-pay | Admitting: Gastroenterology

## 2018-12-16 VITALS — BP 94/65 | HR 79 | Temp 99.1°F | Resp 14 | Ht 65.0 in | Wt 145.0 lb

## 2018-12-16 DIAGNOSIS — D128 Benign neoplasm of rectum: Secondary | ICD-10-CM

## 2018-12-16 DIAGNOSIS — Z1211 Encounter for screening for malignant neoplasm of colon: Secondary | ICD-10-CM

## 2018-12-16 DIAGNOSIS — R1013 Epigastric pain: Secondary | ICD-10-CM

## 2018-12-16 DIAGNOSIS — R1110 Vomiting, unspecified: Secondary | ICD-10-CM

## 2018-12-16 DIAGNOSIS — Z800 Family history of malignant neoplasm of digestive organs: Secondary | ICD-10-CM

## 2018-12-16 DIAGNOSIS — D129 Benign neoplasm of anus and anal canal: Secondary | ICD-10-CM

## 2018-12-16 DIAGNOSIS — B3781 Candidal esophagitis: Secondary | ICD-10-CM | POA: Diagnosis not present

## 2018-12-16 DIAGNOSIS — R109 Unspecified abdominal pain: Secondary | ICD-10-CM | POA: Diagnosis not present

## 2018-12-16 DIAGNOSIS — K5900 Constipation, unspecified: Secondary | ICD-10-CM

## 2018-12-16 DIAGNOSIS — K219 Gastro-esophageal reflux disease without esophagitis: Secondary | ICD-10-CM

## 2018-12-16 LAB — CBC WITH DIFFERENTIAL/PLATELET
Basophils Absolute: 0.1 10*3/uL (ref 0.0–0.1)
Basophils Relative: 1 % (ref 0.0–3.0)
Eosinophils Absolute: 0.1 10*3/uL (ref 0.0–0.7)
Eosinophils Relative: 0.8 % (ref 0.0–5.0)
HCT: 39.9 % (ref 36.0–46.0)
Hemoglobin: 13.5 g/dL (ref 12.0–15.0)
Lymphocytes Relative: 29.5 % (ref 12.0–46.0)
Lymphs Abs: 2.4 10*3/uL (ref 0.7–4.0)
MCHC: 33.9 g/dL (ref 30.0–36.0)
MCV: 91.2 fl (ref 78.0–100.0)
Monocytes Absolute: 0.4 10*3/uL (ref 0.1–1.0)
Monocytes Relative: 5.1 % (ref 3.0–12.0)
Neutro Abs: 5.1 10*3/uL (ref 1.4–7.7)
Neutrophils Relative %: 63.6 % (ref 43.0–77.0)
Platelets: 206 10*3/uL (ref 150.0–400.0)
RBC: 4.37 Mil/uL (ref 3.87–5.11)
RDW: 13.2 % (ref 11.5–15.5)
WBC: 8.1 10*3/uL (ref 4.0–10.5)

## 2018-12-16 LAB — COMPREHENSIVE METABOLIC PANEL
ALT: 8 U/L (ref 0–35)
AST: 14 U/L (ref 0–37)
Albumin: 4.3 g/dL (ref 3.5–5.2)
Alkaline Phosphatase: 34 U/L — ABNORMAL LOW (ref 39–117)
BUN: 5 mg/dL — ABNORMAL LOW (ref 6–23)
CO2: 24 mEq/L (ref 19–32)
Calcium: 8.7 mg/dL (ref 8.4–10.5)
Chloride: 107 mEq/L (ref 96–112)
Creatinine, Ser: 0.73 mg/dL (ref 0.40–1.20)
GFR: 91.16 mL/min (ref >60.00)
Glucose, Bld: 99 mg/dL (ref 70–99)
Potassium: 4 mEq/L (ref 3.5–5.1)
Sodium: 139 mEq/L (ref 135–145)
Total Bilirubin: 0.8 mg/dL (ref 0.2–1.2)
Total Protein: 6.8 g/dL (ref 6.0–8.3)

## 2018-12-16 LAB — TSH: TSH: 1.55 u[IU]/mL (ref 0.35–4.50)

## 2018-12-16 MED ORDER — SODIUM CHLORIDE 0.9 % IV SOLN: 500.0000 mL | Freq: Once | INTRAVENOUS | Status: AC

## 2018-12-16 MED ORDER — FLUCONAZOLE 100 MG PO TABS: 100.0000 mg | ORAL_TABLET | Freq: Every day | ORAL | 0 refills | Status: AC

## 2018-12-16 NOTE — Op Note (Signed)
[facility] Patient Name: Meredith Burns Procedure Date: 12/16/2018 3:30 PM MRN: [id number] Endoscopist: Docia Chuck. Henrene Pastor , MD Age: 33 Referring MD:  Date of Birth: Feb 20, 1986 Gender: Female Account #: [id number] Procedure:                Colonoscopy with cold snare polypectomy x 1 Indications:              Screening in patient at increased risk: Colorectal                            cancer in father before age 58 (45s). Last                            examination 2014- for neoplasia. BRCA gene positive Medicines:                Monitored Anesthesia Care Procedure:                Pre-Anesthesia Assessment:                           - Prior to the procedure, a History and Physical                            was performed, and patient medications and                            allergies were reviewed. The patient's tolerance of                            previous anesthesia was also reviewed. The risks                            and benefits of the procedure and the sedation                            options and risks were discussed with the patient.                            All questions were answered, and informed consent                            was obtained. Prior Anticoagulants: The patient has                            taken no previous anticoagulant or antiplatelet                            agents. ASA Grade Assessment: II - A patient with                            mild systemic disease. After reviewing the risks                            and benefits, the patient was deemed in  satisfactory condition to undergo the procedure.                           After obtaining informed consent, the colonoscope                            was passed under direct vision. Throughout the                            procedure, the patient's blood pressure, pulse, and                            oxygen saturations were monitored continuously. The                             Colonoscope was introduced through the anus and                            advanced to the the cecum, identified by                            appendiceal orifice and ileocecal valve. The                            terminal ileum, ileocecal valve, appendiceal                            orifice, and rectum were photographed. The quality                            of the bowel preparation was adequate to identify                            polyps. The colonoscopy was performed without                            difficulty. The patient tolerated the procedure                            well. The bowel preparation used was SUPREP. Scope In: 3:45:17 PM Scope Out: 3:55:05 PM Scope Withdrawal Time: 0 hours 7 minutes 40 seconds  Total Procedure Duration: 0 hours 9 minutes 48 seconds  Findings:                 The terminal ileum appeared normal.                           A 5 mm polyp was found in the rectum. The polyp was                            removed with a cold snare. Resection and retrieval                            were complete.  The exam was otherwise without abnormality on                            direct and retroflexion views. Complications:            No immediate complications. Estimated blood loss:                            None. Estimated Blood Loss:     Estimated blood loss: none. Impression:               - The examined portion of the ileum was normal.                           - One 5 mm polyp in the rectum, removed with a cold                            snare. Resected and retrieved.                           - The examination was otherwise normal on direct                            and retroflexion views. Recommendation:           - Repeat colonoscopy in 3 years for surveillance.                           - Patient has a contact number available for                            emergencies. The signs and symptoms of potential                             delayed complications were discussed with the                            patient. Return to normal activities tomorrow.                            Written discharge instructions were provided to the                            patient.                           - Resume previous diet.                           - Continue present medications.                           - Await pathology results.                           - EGD today. Please see report regarding findings  and final recommendations and plans John N. Henrene Pastor, MD 12/16/2018 4:11:21 PM This report has been signed electronically.

## 2018-12-16 NOTE — Progress Notes (Signed)
Called to room to assist during endoscopic procedure.  Patient ID and intended procedure confirmed with present staff. Received instructions for my participation in the procedure from the performing physician.  

## 2018-12-16 NOTE — Patient Instructions (Addendum)
Thank you for allowing [place] to care for you today!  Await pathology results by mail, approximately 2 weeks.  Recommend follow-up surveillance colonoscopy in three (3) years.  Resume previous diet and medications today.  Return to normal activities tomorrow.  Await call to schedule the CT .  Write instructions on the sheet that is provided and drink the contrast according to instructions.  Follow up with Dr Henrene Pastor in one (1) month.       YOU HAD AN ENDOSCOPIC PROCEDURE TODAY AT [facility]:   Refer to the procedure report that was given to you for any specific questions about what was found during the examination.  If the procedure report does not answer your questions, please call your gastroenterologist to clarify.  If you requested that your care partner not be given the details of your procedure findings, then the procedure report has been included in a sealed envelope for you to review at your convenience later.  YOU SHOULD EXPECT: Some feelings of bloating in the abdomen. Passage of more gas than usual.  Walking can help get rid of the air that was put into your GI tract during the procedure and reduce the bloating. If you had a lower endoscopy (such as a colonoscopy or flexible sigmoidoscopy) you may notice spotting of blood in your stool or on the toilet paper. If you underwent a bowel prep for your procedure, you may not have a normal bowel movement for a few days.  Please Note:  You might notice some irritation and congestion in your nose or some drainage.  This is from the oxygen used during your procedure.  There is no need for concern and it should clear up in a day or so.  SYMPTOMS TO REPORT IMMEDIATELY:   Following lower endoscopy (colonoscopy or flexible sigmoidoscopy):  Excessive amounts of blood in the stool  Significant tenderness or worsening of abdominal pains  Swelling of the abdomen that is new, acute  Fever of 100F or higher   Following upper  endoscopy (EGD)  Vomiting of blood or coffee ground material  New chest pain or pain under the shoulder blades  Painful or persistently difficult swallowing  New shortness of breath  Fever of 100F or higher  Black, tarry-looking stools  For urgent or emergent issues, a gastroenterologist can be reached at any hour by calling [phone].   DIET:  We do recommend a small meal at first, but then you may proceed to your regular diet.  Drink plenty of fluids but you should avoid alcoholic beverages for 24 hours.  ACTIVITY:  You should plan to take it easy for the rest of today and you should NOT DRIVE or use heavy machinery until tomorrow (because of the sedation medicines used during the test).    FOLLOW UP: Our staff will call the number listed on your records the next business day following your procedure to check on you and address any questions or concerns that you may have regarding the information given to you following your procedure. If we do not reach you, we will leave a message.  However, if you are feeling well and you are not experiencing any problems, there is no need to return our call.  We will assume that you have returned to your regular daily activities without incident.  If any biopsies were taken you will be contacted by phone or by letter within the next 1-3 weeks.  Please call us at [phone] if you  have not heard about the biopsies in 3 weeks.    SIGNATURES/CONFIDENTIALITY: You and/or your care partner have signed paperwork which will be entered into your electronic medical record.  These signatures attest to the fact that that the information above on your After Visit Summary has been reviewed and is understood.  Full responsibility of the confidentiality of this discharge information lies with you and/or your care-partner.

## 2018-12-16 NOTE — Op Note (Signed)
[facility] Patient Name: Meredith Burns Procedure Date: 12/16/2018 3:29 PM MRN: [id number] Endoscopist: Docia Chuck. Henrene Pastor , MD Age: 33 Referring MD:  Date of Birth: [birth year] Gender: Female Account #: [id number] Procedure:                Upper GI endoscopy Indications:              Abdominal pain, Nausea with vomiting Medicines:                Monitored Anesthesia Care Procedure:                Pre-Anesthesia Assessment:                           - Prior to the procedure, a History and Physical                            was performed, and patient medications and                            allergies were reviewed. The patient's tolerance of                            previous anesthesia was also reviewed. The risks                            and benefits of the procedure and the sedation                            options and risks were discussed with the patient.                            All questions were answered, and informed consent                            was obtained. Prior Anticoagulants: The patient has                            taken no previous anticoagulant or antiplatelet                            agents. ASA Grade Assessment: II - A patient with                            mild systemic disease. After reviewing the risks                            and benefits, the patient was deemed in                            satisfactory condition to undergo the procedure.                           After obtaining informed consent, the endoscope was  passed under direct vision. Throughout the                            procedure, the patient's blood pressure, pulse, and                            oxygen saturations were monitored continuously. The                            Endoscope was introduced through the mouth, and                            advanced to the second part of duodenum. The upper                            GI endoscopy  was accomplished without difficulty.                            The patient tolerated the procedure well. Scope In: Scope Out: Findings:                 Diffuse plaques were found in the lower third of                            the esophagus classic for candidal esophagitis.                           The stomach was normal.                           The examined duodenum was normal.                           The cardia and gastric fundus were normal on                            retroflexion. Complications:            No immediate complications. Estimated Blood Loss:     Estimated blood loss: none. Impression:               1. Diffuse Candida esophagitis involving the lower                            third of the esophagus                           2. Otherwise normal EGD. Recommendation:           1. Prescribe Diflucan 100 mg; dispense 7; 200 mg on                            day 1 then 100 mg daily until completed. No refills                           2. CBC, comprehensive metabolic panel, TSH today  3. Schedule contrast-enhanced CT scan of the                            abdomen and pelvis "persistent and worsening                            abdominal pain and vomiting. Please evaluate".                           4. We will contact you with the results                           5. Office follow-up with Dr. Henrene Pastor in 4 weeks Docia Chuck. Henrene Pastor, MD 12/16/2018 4:18:01 PM This report has been signed electronically.

## 2018-12-16 NOTE — Progress Notes (Signed)
Alert and oriented x 3, pleased with MAC, report to RN

## 2018-12-17 ENCOUNTER — Telehealth: Payer: Self-pay

## 2018-12-17 ENCOUNTER — Other Ambulatory Visit: Payer: Self-pay

## 2018-12-17 DIAGNOSIS — R109 Unspecified abdominal pain: Secondary | ICD-10-CM

## 2018-12-17 NOTE — Telephone Encounter (Signed)
Pt scheduled for CT of A/P at [facility] CT 12/22/18!11:15am. Pt to have clear liquids, no food after 7:15am, pt to drink bottle one of contrast at 9:15am and bottle 2 at 10:15am. Left message for pt to call back.

## 2018-12-17 NOTE — Telephone Encounter (Signed)
Yes.  CT without IV contrast.  Thank you

## 2018-12-17 NOTE — Telephone Encounter (Signed)
Pt needs CT of A/P, she is allergic to prednisone and iodine. The premeds CT uses for iodine allergies are prednisone and benadryl. Do you want pt to have Ct without contrast? Please advise.

## 2018-12-17 NOTE — Telephone Encounter (Signed)
Left message for pt to call back.  Notes recorded by Irene Shipper, MD on 12/16/2018 at 8:05 PM EST Please let the patient know that her blood work all looks great. Keep plans for CT

## 2018-12-17 NOTE — Telephone Encounter (Signed)
Pt aware of results and appt.

## 2018-12-17 NOTE — Telephone Encounter (Signed)
Left message on f/u call 

## 2018-12-17 NOTE — Telephone Encounter (Signed)
Patient states she is returning call about setting up appt. Pt notified of follow up and CT but would like a call back from the nurse to discuss details.

## 2018-12-17 NOTE — Telephone Encounter (Signed)
  Follow up Call-  Call back number 12/16/2018 12/16/2018  Post procedure Call Back phone  # [phone] Heron Sabins - husband- speak with him [phone]  Permission to leave phone message - Yes  Some recent data might be hidden     Patient questions:  Do you have a fever, pain , or abdominal swelling? No. Pain Score  0 *  Have you tolerated food without any problems? yes  Have you been able to return to your normal activities? Yes.    Do you have any questions about your discharge instructions: Diet   No. Medications  No. Follow up visit  No.  Do you have questions or concerns about your Care? No.  Actions: * If pain score is 4 or above: No action needed, pain <4.

## 2018-12-19 ENCOUNTER — Encounter: Payer: Self-pay | Admitting: Internal Medicine

## 2018-12-22 ENCOUNTER — Inpatient Hospital Stay: Admission: RE | Admit: 2018-12-22 | Payer: BLUE CROSS/BLUE SHIELD | Source: Ambulatory Visit

## 2019-01-15 ENCOUNTER — Ambulatory Visit: Payer: BLUE CROSS/BLUE SHIELD | Admitting: Internal Medicine

## 2020-02-04 DIAGNOSIS — Z1211 Encounter for screening for malignant neoplasm of colon: Secondary | ICD-10-CM | POA: Insufficient documentation

## 2022-04-14 ENCOUNTER — Emergency Department (HOSPITAL_COMMUNITY)
Admission: EM | Admit: 2022-04-14 | Discharge: 2022-04-14 | Discharge status: Against Medical Advice | Payer: BLUE CROSS/BLUE SHIELD

## 2022-04-14 NOTE — ED Triage Notes (Addendum)
Patient came in via EMS but refusing to be triaged and requesting to leave AMA off of EMS stretcher.

## 2022-05-24 ENCOUNTER — Other Ambulatory Visit: Payer: Self-pay | Admitting: Cardiovascular Disease

## 2022-05-24 ENCOUNTER — Ambulatory Visit
Admission: RE | Admit: 2022-05-24 | Discharge: 2022-05-24 | Disposition: A | Discharge status: Home / Self Care | Payer: BC Managed Care – PPO | Source: Ambulatory Visit | Attending: Cardiovascular Disease | Admitting: Cardiovascular Disease

## 2022-05-24 DIAGNOSIS — M545 Low back pain, unspecified: Secondary | ICD-10-CM

## 2022-05-24 DIAGNOSIS — M25551 Pain in right hip: Secondary | ICD-10-CM

## 2022-11-30 ENCOUNTER — Other Ambulatory Visit: Payer: Self-pay | Admitting: Surgery

## 2022-11-30 DIAGNOSIS — Z1509 Genetic susceptibility to other malignant neoplasm: Secondary | ICD-10-CM

## 2022-11-30 DIAGNOSIS — R1904 Left lower quadrant abdominal swelling, mass and lump: Secondary | ICD-10-CM

## 2022-12-05 ENCOUNTER — Other Ambulatory Visit: Payer: Self-pay | Admitting: Surgery

## 2022-12-05 DIAGNOSIS — Z1509 Genetic susceptibility to other malignant neoplasm: Secondary | ICD-10-CM

## 2022-12-05 DIAGNOSIS — E237 Disorder of pituitary gland, unspecified: Secondary | ICD-10-CM

## 2022-12-06 ENCOUNTER — Ambulatory Visit
Admission: RE | Admit: 2022-12-06 | Discharge: 2022-12-06 | Disposition: A | Discharge status: Home / Self Care | Payer: BC Managed Care – PPO | Source: Ambulatory Visit | Attending: Surgery | Admitting: Surgery

## 2022-12-06 DIAGNOSIS — Z1501 Genetic susceptibility to malignant neoplasm of breast: Secondary | ICD-10-CM

## 2022-12-06 DIAGNOSIS — R1904 Left lower quadrant abdominal swelling, mass and lump: Secondary | ICD-10-CM

## 2022-12-06 DIAGNOSIS — E237 Disorder of pituitary gland, unspecified: Secondary | ICD-10-CM

## 2023-07-19 ENCOUNTER — Telehealth: Payer: Self-pay | Admitting: Internal Medicine

## 2023-07-19 NOTE — Telephone Encounter (Signed)
Inbound call from patient's mother requesting a office visit for patient. States patient has "masses in her stomach" but is unsure. Patient was seen with [facility] last in 2023 and had a a endoscopy in 2022. Patient requesting to return back to Dr. Lamar Sprinkles care. Patient's mother stated patient was been seen at the cancer center through [facility] and believed she had to stay with [facility] due to being seen at the cancer center. Patient's mother advised she did not have to do that and now would like to return to Dr. Marina Goodell. Patient will have records sent over for review.

## 2023-09-11 ENCOUNTER — Encounter: Payer: Self-pay | Admitting: Allergy & Immunology

## 2023-09-11 ENCOUNTER — Ambulatory Visit: Payer: BC Managed Care – PPO | Admitting: Allergy & Immunology

## 2023-09-11 ENCOUNTER — Other Ambulatory Visit: Payer: Self-pay

## 2023-09-11 VITALS — BP 98/68 | HR 94 | Temp 98.5°F | Resp 20 | Ht 65.0 in | Wt 136.6 lb

## 2023-09-11 DIAGNOSIS — L508 Other urticaria: Secondary | ICD-10-CM

## 2023-09-11 DIAGNOSIS — R220 Localized swelling, mass and lump, head: Secondary | ICD-10-CM

## 2023-09-11 DIAGNOSIS — R221 Localized swelling, mass and lump, neck: Secondary | ICD-10-CM

## 2023-09-11 MED ORDER — CETIRIZINE HCL 10 MG PO TABS: 10.0000 mg | ORAL_TABLET | Freq: Every day | ORAL | 5 refills | Status: AC

## 2023-09-11 MED ORDER — LEVOCETIRIZINE DIHYDROCHLORIDE 5 MG PO TABS: 5.0000 mg | ORAL_TABLET | Freq: Two times a day (BID) | ORAL | 5 refills | Status: AC

## 2023-09-11 NOTE — Progress Notes (Signed)
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 Chronic urticaria - Your history does not have any "red flags" such as fevers, joint pains, or permanent skin changes that would be concerning for a more serious cause of hives.  - I do not like how long they have lasted. - Unfortunately, we could not do testing today because of stupid insurance companies.  - We will do testing at the next visit.  - We will get some labs to rule out serious causes of hives: complete blood count, tryptase level, chronic urticaria panel, CMP, ESR, and CRP. - Chronic hives are often times a self limited process and will "burn themselves out" over 6-12 months, although this is not always the case.  - In the meantime, start suppressive dosing of antihistamines:   - Morning: Zyrtec (cetirizine) 10mg  (one tablet)  - Evening: Xyzal (levocetirizine) 10mg  (two tablets) - You can change this dosing at home, decreasing the dose as needed or increasing the dosing as needed.  - If you are not tolerating the medications or are tired of taking them every day, we can start treatment with a monthly injectable medication called Xolair.   2. Tree nut allergy - We will get testing to look for tree nut allergies. - We will call you in 1-2 weeks with the results of the testing.  3. Return in about 1 week (around 09/18/2023). You can have the follow up appointment with Dr. Dellis Anes or a Nurse Practicioner (our Nurse Practitioners are excellent and always have Physician oversight!).   This note in its entirety was forwarded to the Provider who requested this consultation.  Subjective:   Lamar Naef is a 37 y.o. female presenting  today for evaluation of  Chief Complaint  Patient presents with   Establish Care   Urticaria   Rash   Pruritus    Kalena Mander has a history of the following: Patient Active Problem List   Diagnosis Date Noted   Constipation 10/01/2013   Nausea with vomiting 10/01/2013   Abdominal pain 10/01/2013   BACK PAIN 09/22/2007   HEADACHE, CHRONIC 09/22/2007   ATTENTION DEFICIT DISORDER, HX OF 09/22/2007   ADRENAL INSUFFICIENCY, HX OF 09/22/2007   SYMPTOM, PAIN, ABDOMINAL, GENERALIZED 08/18/2007   ACNE, MILD 02/03/2007   WEIGHT GAIN 02/03/2007   HEMATURIA 12/24/2006   SYNCOPE 12/24/2006   VOMITING 12/24/2006   FLANK PAIN, LEFT 12/24/2006   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  Discussed the use of AI scribe software for clinical note transcription with the patient and/or guardian, who gave verbal consent to proceed.  Jayce Boyko was referred by Eartha Inch, MD.     Rahma is a 37 y.o. female presenting for an evaluation of urticaria for three years duration .  Mekisha presents with a worsening skin condition over the past few years. They report frequent breakouts, particularly during the summer, and have been told they may be allergic to their own sweat glands. The patient has undergone multiple biopsies, one on the neck and another on the chest, with a diagnosis of folliculitis. Despite this, the condition has been progressively  worsening over three years.   The patient describes the skin condition as intensely itchy, leading to scratching and subsequent blistering. The hives are widespread, affecting the arms, chest, back, and neck. The patient also reports redness associated with the hives, which is not related to sun exposure.  The patient has tried various treatments, including Zyrtec, montelukast, and prednisone shots. The Zyrtec, taken twice daily, and montelukast did not  provide relief. The prednisone shots, however, seemed to reduce the intensity of the hives and alleviate the itching, but did not completely resolve the condition.  The patient also reports a history of breast cancer, with a monoallelic mutation of the ATM gene, which predisposes them to the disease. They had a spot removed early, avoiding the need for chemotherapy or radiation.   The patient also has a known allergy to tree nuts, which causes swelling, and has an out-of-date EpiPen. They also report occasional ear pain, particularly in one ear, and a change in skin pigmentation during the summer noted especially in the perioral area. She is unsure what is causing this.   The patient[place] and working as an Insurance claims handler, currently caring for their mother who recently had a stroke. The skin condition does not seem to change depending on location. The patient also reports using a non-nicotine vape, which they are trying to quit due to current stressors.   Otherwise, there is no history of other atopic diseases, including asthma, environmental allergies, stinging insect allergies, or contact dermatitis. There is no significant infectious history. Vaccinations are up to date.    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 If you have any questions, ask your nurse or doctor.          acetaminophen 500 MG tablet Commonly known as: TYLENOL Take 1,000 mg by mouth every 8 (eight) hours as needed for mild pain or moderate pain.   ALPRAZolam 0.5 MG tablet Commonly known as: XANAX Take 0.5 mg by mouth 2 (two) times daily as needed for anxiety.   amLODipine 5 MG tablet Commonly known as: NORVASC Take by mouth.   amphetamine-dextroamphetamine 30 MG tablet Commonly known as: ADDERALL Take 1 tablet by mouth 2 (two) times daily.   clonazePAM 0.5 MG tablet Commonly known as: KLONOPIN Take 1 tablet by mouth 3 (three)  times daily as needed.   cyclobenzaprine 10 MG tablet Commonly known as: FLEXERIL Take by mouth.   dicyclomine 20 MG tablet Commonly known as: BENTYL Take by mouth.   EpiPen 0.3 mg/0.3 mL Devi Generic drug: EPINEPHrine Inject 0.3 mg into the muscle once.   hydrocortisone 2.5 % cream Apply to affected area 2 times daily   metoCLOPramide 10 MG tablet Commonly known as: Reglan Take one tablet about 20 minutes before drinking each half of the prep.   pantoprazole 40 MG tablet Commonly known as: PROTONIX Take one tablet by mouth 30-60 minutes before breakfast   promethazine 25 MG tablet Commonly known as: PHENERGAN Take 1 tablet (25 mg total) by mouth every 6 (six) hours as needed for nausea.   sertraline 100 MG tablet Commonly known as: ZOLOFT Take 1 tablet by mouth daily.   traMADol 50 MG tablet Commonly known as: ULTRAM Take by mouth.   tretinoin 0.05 % cream Commonly known as: RETIN-A apply to face at bedtime as  directed   triamcinolone cream 0.1 % Commonly known as: KENALOG Apply topically 2 (two) times daily as needed.        Birth History: non-contributory  Developmental History: non-contributory  Past Surgical History: Past Surgical History:  Procedure Laterality Date   CESAREAN SECTION     x 3    CHOLECYSTECTOMY     COSMETIC SURGERY Right    glass in leg-plastic surgery   MASTECTOMY, PARTIAL Right      Family History: Family History  Problem Relation Age of Onset   Urticaria Mother    Eczema Mother    Asthma Mother    Allergic rhinitis Mother    Irritable bowel syndrome Mother    Colon polyps Mother    Colon cancer Father 60   Colon polyps Father    Heart disease Father    COPD Father    Lung cancer Maternal Grandmother    Breast cancer Maternal Grandmother    Colon polyps Paternal Grandfather      Social History: Alyshia lives at home with her family.  Lives in a house that is 39 years old.  There is wood flooring throughout the home.  They have gas and electric heating with central cooling.  There are dogs inside of the home.  There are dust mite covers on the bed, but not the pillows.  There is vape exposure in the house as well as the car.  She currently works as a Engineer, civil (consulting) for a Engineer, drilling.  She does not have a HEPA filter.  She does not have any fume, chemical, or dust exposure.  She does not live near an interstate or industrial area.   Review of systems otherwise negative other than that mentioned in the HPI.    Objective:   Blood pressure 98/68, pulse 94, temperature 98.5 F (36.9 C), resp. rate 20, height 5\' 5"  (1.651 m), weight 136 lb 9.6 oz (62 kg), last menstrual period 11/14/2013, SpO2 98%. Body mass index is 22.73 kg/m.     Physical Exam Vitals reviewed.  Constitutional:      Appearance: She is well-developed.     Comments: Anxious.   HENT:     Head: Normocephalic and atraumatic.     Right Ear: Tympanic membrane, ear canal and  external ear normal. No drainage, swelling or tenderness. Tympanic membrane is not injected, scarred, erythematous, retracted or bulging.     Left Ear: Tympanic membrane, ear canal and external ear normal. No drainage,  swelling or tenderness. Tympanic membrane is not injected, scarred, erythematous, retracted or bulging.     Nose: No nasal deformity, septal deviation, mucosal edema or rhinorrhea.     Right Sinus: No maxillary sinus tenderness or frontal sinus tenderness.     Left Sinus: No maxillary sinus tenderness or frontal sinus tenderness.     Mouth/Throat:     Mouth: Mucous membranes are not pale and not dry.     Pharynx: Uvula midline.  Eyes:     General:        Right eye: No discharge.        Left eye: No discharge.     Conjunctiva/sclera: Conjunctivae normal.     Right eye: Right conjunctiva is not injected. No chemosis.    Left eye: Left conjunctiva is not injected. No chemosis.    Pupils: Pupils are equal, round, and reactive to light.  Cardiovascular:     Rate and Rhythm: Normal rate and regular rhythm.     Heart sounds: Normal heart sounds.  Pulmonary:     Effort: Pulmonary effort is normal. No tachypnea, accessory muscle usage or respiratory distress.     Breath sounds: Normal breath sounds. No wheezing, rhonchi or rales.  Chest:     Chest wall: No tenderness.  Abdominal:     Tenderness: There is no abdominal tenderness. There is no guarding or rebound.  Lymphadenopathy:     Head:     Right side of head: No submandibular, tonsillar or occipital adenopathy.     Left side of head: No submandibular, tonsillar or occipital adenopathy.     Cervical: No cervical adenopathy.  Skin:    General: Skin is warm.     Capillary Refill: Capillary refill takes less than 2 seconds.     Coloration: Skin is not pale.     Findings: Erythema and rash present. No abrasion or petechiae. Rash is not papular, urticarial or vesicular.     Comments: There is confluent erythema on the face as  well as the neck, appearing almost as a sun burn. She does have some papules on the rest of her body with excoriations present as well.   Neurological:     General: No focal deficit present.     Mental Status: She is alert.  Psychiatric:        Behavior: Behavior is cooperative.      Diagnostic studies: deferred due to insurance stipulations that refuse to pay for testing at initial visits, making it more difficult for patients to get the care they need. But we did send some labs.          Malachi Bonds, MD [facility]

## 2023-09-11 NOTE — Patient Instructions (Addendum)
1. Chronic urticaria - Your history does not have any "red flags" such as fevers, joint pains, or permanent skin changes that would be concerning for a more serious cause of hives.  - I do not like how long they have lasted. - Unfortunately, we could not do testing today because of stupid insurance companies.  - We will do testing at the next visit.  - We will get some labs to rule out serious causes of hives: complete blood count, tryptase level, chronic urticaria panel, CMP, ESR, and CRP. - Chronic hives are often times a self limited process and will "burn themselves out" over 6-12 months, although this is not always the case.  - In the meantime, start suppressive dosing of antihistamines:   - Morning: Zyrtec (cetirizine) 10mg  (one tablet)  - Evening: Xyzal (levocetirizine) 10mg  (two tablets) - You can change this dosing at home, decreasing the dose as needed or increasing the dosing as needed.  - If you are not tolerating the medications or are tired of taking them every day, we can start treatment with a monthly injectable medication called Xolair.   2. Tree nut allergy - We will get testing to look for tree nut allergies. - We will call you in 1-2 weeks with the results of the testing.  3. Return in about 1 week (around 09/18/2023). You can have the follow up appointment with Dr. Dellis Anes or a Nurse Practicioner (our Nurse Practitioners are excellent and always have Physician oversight!).    Please inform us of any Emergency Department visits, hospitalizations, or changes in symptoms. Call us before going to the ED for breathing or allergy symptoms since we might be able to fit you in for a sick visit. Feel free to contact us anytime with any questions, problems, or concerns.  It was a pleasure to meet you today!  Websites that have reliable patient information: 1. American Academy of Asthma, Allergy, and Immunology: www.aaaai.org 2. Food Allergy Research and Education (FARE):  foodallergy.org 3. Mothers of Asthmatics: http://www.asthmacommunitynetwork.org 4. American College of Allergy, Asthma, and Immunology: www.acaai.org   COVID-19 Vaccine Information can be found at: [email or web address].nl For questions related to vaccine distribution or appointments, please email [email or web address] or call [phone].     "Like" [place] on Facebook and Instagram for our latest updates!      A healthy democracy works best when [employer] participate! Make sure you are registered to vote! If you have moved or changed any of your contact information, you will need to get this updated before voting! Scan the QR codes below to learn more!

## 2023-09-11 NOTE — Addendum Note (Signed)
Addended by: Orson Aloe on: 09/11/2023 04:47 PM   Modules accepted: Orders

## 2023-09-25 ENCOUNTER — Ambulatory Visit: Payer: BC Managed Care – PPO | Admitting: Allergy & Immunology

## 2023-09-25 DIAGNOSIS — L508 Other urticaria: Secondary | ICD-10-CM

## 2023-09-25 DIAGNOSIS — J3089 Other allergic rhinitis: Secondary | ICD-10-CM

## 2023-09-25 DIAGNOSIS — J302 Other seasonal allergic rhinitis: Secondary | ICD-10-CM | POA: Diagnosis not present

## 2023-09-25 DIAGNOSIS — R220 Localized swelling, mass and lump, head: Secondary | ICD-10-CM | POA: Diagnosis not present

## 2023-09-25 DIAGNOSIS — R221 Localized swelling, mass and lump, neck: Secondary | ICD-10-CM

## 2023-09-25 NOTE — Patient Instructions (Addendum)
1. Chronic urticaria - Get the labs done to complete the workup.  - Testing showed many possible triggers, but I still think we need to advance care to Xolair. - Testing to the most common foods was negative, ruling out > 95% of all food allergies.  - Chronic hives are often times a self limited process and will "burn themselves out" over 6-12 months, although this is not always the case.  - In the meantime, restart suppressive dosing of antihistamines:   - Morning: Zyrtec (cetirizine) 10mg  (one tablet)v   - Evening: Xyzal (levocetirizine) 10mg  (two tablets) + Doxepin 50mg  - You can change this dosing at home, decreasing the dose as needed or increasing the dosing as needed.  - If you are not tolerating the medications or are tired of taking them every day, we can start treatment with a monthly injectable medication called Xolair.   2. Tree nut allergy - We will get testing to look for tree nut allergies.  3. Seasonal and perennial allergic rhinitis - Testing today showed: grasses, weeds, trees, outdoor molds, and cat - Copy of test results provided.  - Avoidance measures provided.  - Restart taking: antihistamine regimen as above - You can use an extra dose of the antihistamine, if needed, for breakthrough symptoms.  - Consider nasal saline rinses 1-2 times daily to remove allergens from the nasal cavities as well as help with mucous clearance (this is especially helpful to do before the nasal sprays are given)  4. Return in about 2 months (around 11/25/2023). You can have the follow up appointment with Dr. Dellis Anes or a Nurse Practicioner (our Nurse Practitioners are excellent and always have Physician oversight!).    Please inform us of any Emergency Department visits, hospitalizations, or changes in symptoms. Call us before going to the ED for breathing or allergy symptoms since we might be able to fit you in for a sick visit. Feel free to contact us anytime with any questions,  problems, or concerns.  It was a pleasure to see you again today!  Websites that have reliable patient information: 1. American Academy of Asthma, Allergy, and Immunology: www.aaaai.org 2. Food Allergy Research and Education (FARE): foodallergy.org 3. Mothers of Asthmatics: http://www.asthmacommunitynetwork.org 4. American College of Allergy, Asthma, and Immunology: www.acaai.org   COVID-19 Vaccine Information can be found at: [email or web address].nl For questions related to vaccine distribution or appointments, please email [email or web address] or call [phone].     "Like" [place] on Facebook and Instagram for our latest updates!      A healthy democracy works best when [employer] participate! Make sure you are registered to vote! If you have moved or changed any of your contact information, you will need to get this updated before voting! Scan the QR codes below to learn more!       Airborne Adult Perc - 09/25/23 1413     Time Antigen Placed 1413    Allergen Manufacturer Waynette Buttery    Location Back    Number of Test 55    1. Control-Buffer 50% Glycerol Negative    2. Control-Histamine 3+    3. [place]ia 3+    4. [place] 3+    5. Johnson 3+    6. Kentucky Blue 3+    7. Meadow Fescue 3+    8. Perennial Rye 3+    9. Timothy 3+    10. Ragweed Mix Negative    11. Cocklebur Negative    12. Plantain,  English Negative    13. Baccharis  Negative    14. Dog Fennel Negative    15. Russian Thistle Negative    16. Lamb's Quarters Negative    17. Sheep Sorrell Negative    18. Rough Pigweed Negative    19. Marsh Elder, Rough Negative    20. Mugwort, Common Negative    21. Box, Elder Negative    22. Cedar, red Negative    23. Sweet Gum Negative    24. Pecan Pollen Negative    25. Pine Mix Negative    26. Walnut, Black Pollen Negative    27. Red Mulberry Negative    28. Ash Mix Negative    29. Birch Mix Negative    30. Beech  American Negative    31. Cottonwood, Guinea[place]iss[place] Negative    32. Hickory, White Negative    33. Maple Mix Negative    34. Oak, [place] Mix Negative    35. Sycamore Eastern Negative    36. Alternaria Alternata 3+    37. Cladosporium Herbarum Negative    38. Aspergillus Mix Negative    39. Penicillium Mix Negative    40. Bipolaris Sorokiniana (Helminthosporium) Negative    41. Drechslera Spicifera (Curvularia) 3+    42. Mucor Plumbeus Negative    43. Fusarium Moniliforme Negative    44. Aureobasidium Pullulans (pullulara) Negative    45. Rhizopus Oryzae Negative    46. Botrytis Cinera Negative    47. Epicoccum Nigrum Negative    48. Phoma Betae Negative    49. Dust Mite Mix Negative    50. Cat Hair 10,000 BAU/ml Negative    51.  Dog Epithelia Negative    52. Mixed Feathers Negative    53. Horse Epithelia Negative    54. Cockroach, German Negative    55. Tobacco Leaf Negative             13 Food Perc - 09/25/23 1414       Test Information   Time Antigen Placed 1414    Allergen Manufacturer Waynette Buttery    Location Back    Number of allergen test 13      Food   1. Peanut Negative    2. Soybean Negative    3. Wheat Negative    4. Sesame Negative    5. Milk, Cow Negative    6. Casein Negative    7. Egg White, Chicken Negative    8. Shellfish Mix Negative    9. Fish Mix Negative    10. Cashew Negative    11. Walnut Food Negative    12. Almond Negative    13. Hazelnut Negative             Intradermal - 09/25/23 1436     Time Antigen Placed 1436    Allergen Manufacturer Waynette Buttery    Location Arm    Number of Test 10    Control Negative    Ragweed Mix Negative    Weed Mix 2+    Tree Mix 1+    Mold 2 Negative    Mold 4 Negative    Mite Mix Negative    Cat 1+    Dog Negative    Cockroach Negative             Reducing Pollen Exposure  The American Academy of Allergy, Asthma and Immunology suggests the following steps to reduce your exposure to pollen during  allergy seasons.    Do not hang sheets or clothing out to dry; pollen may collect on these items.  Do not mow lawns or spend time around freshly cut grass; mowing stirs up pollen. Keep windows closed at night.  Keep car windows closed while driving. Minimize morning activities outdoors, a time when pollen counts are usually at their highest. Stay indoors as much as possible when pollen counts or humidity is high and on windy days when pollen tends to remain in the air longer. Use air conditioning when possible.  Many air conditioners have filters that trap the pollen spores. Use a HEPA room air filter to remove pollen form the indoor air you breathe.  Control of Mold Allergen   Mold and fungi can grow on a variety of surfaces provided certain temperature and moisture conditions exist.  Outdoor molds grow on plants, decaying vegetation and soil.  The major outdoor mold, Alternaria and Cladosporium, are found in very high numbers during hot and dry conditions.  Generally, a late Summer - Fall peak is seen for common outdoor fungal spores.  Rain will temporarily lower outdoor mold spore count, but counts rise rapidly when the rainy period ends.  The most important indoor molds are Aspergillus and Penicillium.  Dark, humid and poorly ventilated basements are ideal sites for mold growth.  The next most common sites of mold growth are the bathroom and the kitchen.  Outdoor (Seasonal) Mold Control  Positive outdoor molds via skin testing: Alternaria and Drechslera (Curvalaria)  Use air conditioning and keep windows closed Avoid exposure to decaying vegetation. Avoid leaf raking. Avoid grain handling. Consider wearing a face mask if working in moldy areas.     Control of Dog or Cat Allergen  Avoidance is the best way to manage a dog or cat allergy. If you have a dog or cat and are allergic to dog or cats, consider removing the dog or cat from the home. If you have a dog or cat but don't want to  find it a new home, or if your family wants a pet even though someone in the household is allergic, here are some strategies that may help keep symptoms at bay:  Keep the pet out of your bedroom and restrict it to only a few rooms. Be advised that keeping the dog or cat in only one room will not limit the allergens to that room. Don't pet, hug or kiss the dog or cat; if you do, wash your hands with soap and water. High-efficiency particulate air (HEPA) cleaners run continuously in a bedroom or living room can reduce allergen levels over time. Regular use of a high-efficiency vacuum cleaner or a central vacuum can reduce allergen levels. Giving your dog or cat a bath at least once a week can reduce airborne allergen.

## 2023-09-25 NOTE — Progress Notes (Signed)
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 Chronic urticaria - Get the labs done to complete the workup.  - Testing showed many possible triggers, but I still think we need to advance care to Xolair. - Testing to the most common foods was negative, ruling out > 95% of all food allergies.  - Chronic hives are often times a self limited process and will "burn themselves out" over 6-12 months, although this is not always the case.  - In the meantime, restart suppressive dosing of antihistamines:   - Morning: Zyrtec (cetirizine) 10mg  (one tablet)v   - Evening: Xyzal (levocetirizine) 10mg  (two tablets) + Doxepin 50mg  - You can change this dosing at home, decreasing the dose as needed or increasing the dosing as needed.  - If you are not tolerating the medications or are tired of taking them every day, we can start treatment with a monthly injectable medication called Xolair.   2. Tree nut allergy - We will get testing to look for tree nut allergies.  3. Seasonal and perennial allergic rhinitis - Testing today showed: grasses, weeds, trees, outdoor molds, and cat - Copy of test results provided.  - Avoidance measures provided.  - Restart taking: antihistamine regimen as above - You can use an extra dose of the antihistamine, if needed, for breakthrough symptoms.  - Consider nasal saline rinses 1-2 times daily to remove allergens from the nasal cavities as well as help with mucous clearance (this is especially helpful to do before the nasal sprays are given)  4. Return in about 2 months (around  11/25/2023). You can have the follow up appointment with Dr. Dellis Anes or a Nurse Practicioner (our Nurse Practitioners are excellent and always have Physician oversight!).   Subjective:   Meredith Burns is a 37 y.o. female presenting today for follow up of  Chief Complaint  Patient presents with   Allergy Testing    Meredith Burns has a history of the following: Patient Active Problem List   Diagnosis Date Noted   Constipation 10/01/2013   Nausea with vomiting 10/01/2013   Abdominal pain 10/01/2013   BACK PAIN 09/22/2007   HEADACHE, CHRONIC 09/22/2007   ATTENTION DEFICIT DISORDER, HX OF 09/22/2007   ADRENAL INSUFFICIENCY, HX OF 09/22/2007   SYMPTOM, PAIN, ABDOMINAL, GENERALIZED 08/18/2007   ACNE, MILD 02/03/2007   WEIGHT GAIN 02/03/2007   HEMATURIA 12/24/2006   SYNCOPE 12/24/2006   VOMITING 12/24/2006   FLANK PAIN, LEFT 12/24/2006   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  Discussed the use of AI scribe software for clinical note transcription with the patient and/or guardian, who gave verbal consent to proceed.  Meredith Burns is a 37 y.o. female presenting for skin testing. She was last seen on October 16th. We could not do testing because her insurance company does not cover testing on the same day as a New Patient visit. She has been off of all antihistamines 3 days in anticipation of the testing.   She has not had her labs collected  yet. She forgot about it. She is miserable today without the antihistamines on board. Prednisone has been the only thing that seems to have helped in the past.   Otherwise, there have been no changes to her past medical history, surgical history, family history, or social history.    Review of systems otherwise negative other than that mentioned in the HPI.    Objective:   Last menstrual period 11/14/2013. There is no height or weight on file to calculate  BMI.    Physical exam deferred since this was a skin testing appointment only.   Diagnostic studies:   Allergy Studies:     Airborne Adult Perc - 09/25/23 1413     Time Antigen Placed 1413    Allergen Manufacturer Waynette Buttery    Location Back    Number of Test 55    1. Control-Buffer 50% Glycerol Negative    2. Control-Histamine 3+    3. Bahia 3+    4. [place] 3+    5. Johnson 3+    6. [place] Blue 3+    7. Meadow Fescue 3+    8. Perennial Rye 3+    9. Timothy 3+    10. Ragweed Mix Negative    11. Cocklebur Negative    12. Plantain,  English Negative    13. Baccharis Negative    14. Dog Fennel Negative    15. Russian Thistle Negative    16. Lamb's Quarters Negative    17. Sheep Sorrell Negative    18. Rough Pigweed Negative    19. Marsh Elder, Rough Negative    20. Mugwort, Common Negative    21. Box, Elder Negative    22. Cedar, red Negative    23. Sweet Gum Negative    24. Pecan Pollen Negative    25. Pine Mix Negative    26. Walnut, Black Pollen Negative    27. Red Mulberry Negative    28. Ash Mix Negative    29. Birch Mix Negative    30. Beech American Negative    31. Cottonwood, Guinea-Bissau Negative    32. Hickory, White Negative    33. Maple Mix Negative    34. Oak, Guinea[place] Mix Negative    35. Sycamore Eastern Negative    36. Alternaria Alternata 3+    37. Cladosporium Herbarum Negative    38. Aspergillus Mix Negative    39. Penicillium Mix Negative    40. Bipolaris Sorokiniana (Helminthosporium) Negative    41. Drechslera Spicifera (Curvularia) 3+    42. Mucor Plumbeus Negative    43. Fusarium Moniliforme Negative    44. Aureobasidium Pullulans (pullulara) Negative    45. Rhizopus Oryzae Negative    46. Botrytis Cinera Negative    47. Epicoccum Nigrum Negative    48. Phoma Betae Negative    49. Dust Mite Mix Negative    50. Cat Hair 10,000 BAU/ml Negative    51.  Dog Epithelia Negative    52. Mixed Feathers Negative    53. Horse Epithelia Negative     54. Cockroach, German Negative    55. Tobacco Leaf Negative             13 Food Perc - 09/25/23 1414       Test Information   Time Antigen Placed 1414    Allergen Manufacturer Waynette Buttery    Location Back    Number of allergen test 13      Food   1. Peanut Negative    2.  Soybean Negative    3. Wheat Negative    4. Sesame Negative    5. Milk, Cow Negative    6. Casein Negative    7. Egg White, Chicken Negative    8. Shellfish Mix Negative    9. Fish Mix Negative    10. Cashew Negative    11. Walnut Food Negative    12. Almond Negative    13. Hazelnut Negative             Intradermal - 09/25/23 1436     Time Antigen Placed 1436    Allergen Manufacturer Waynette Buttery    Location Arm    Number of Test 10    Control Negative    Ragweed Mix Negative    Weed Mix 2+    Tree Mix 1+    Mold 2 Negative    Mold 4 Negative    Mite Mix Negative    Cat 1+    Dog Negative    Cockroach Negative             Allergy testing results were read and interpreted by myself, documented by clinical staff.      Malachi Bonds, MD  [facility]

## 2023-09-27 ENCOUNTER — Encounter: Payer: Self-pay | Admitting: Allergy & Immunology

## 2023-09-27 MED ORDER — DOXEPIN HCL 50 MG PO CAPS: 50.0000 mg | ORAL_CAPSULE | Freq: Every day | ORAL | 1 refills | Status: DC

## 2023-09-30 ENCOUNTER — Encounter: Payer: Self-pay | Admitting: Allergy & Immunology

## 2023-09-30 DIAGNOSIS — R768 Other specified abnormal immunological findings in serum: Secondary | ICD-10-CM

## 2023-10-02 NOTE — Telephone Encounter (Signed)
Lab results have been placed to be mailed out to the address on file.

## 2023-10-04 LAB — FANA STAINING PATTERNS: Speckled Pattern: 1:80 {titer}

## 2023-10-04 LAB — ANTINUCLEAR ANTIBODIES, IFA: ANA Titer 1: POSITIVE — AB

## 2023-10-04 LAB — CBC WITH DIFFERENTIAL/PLATELET
Basophils Absolute: 0 10*3/uL (ref 0.0–0.2)
Basos: 0 %
EOS (ABSOLUTE): 0 10*3/uL (ref 0.0–0.4)
Eos: 1 %
Hematocrit: 38.7 % (ref 34.0–46.6)
Hemoglobin: 13 g/dL (ref 11.1–15.9)
Immature Grans (Abs): 0 10*3/uL (ref 0.0–0.1)
Immature Granulocytes: 0 %
Lymphocytes Absolute: 1.9 10*3/uL (ref 0.7–3.1)
Lymphs: 37 %
MCH: 31.9 pg (ref 26.6–33.0)
MCHC: 33.6 g/dL (ref 31.5–35.7)
MCV: 95 fL (ref 79–97)
Monocytes Absolute: 0.3 10*3/uL (ref 0.1–0.9)
Monocytes: 6 %
Neutrophils Absolute: 3 10*3/uL (ref 1.4–7.0)
Neutrophils: 56 %
Platelets: 292 10*3/uL (ref 150–450)
RBC: 4.08 x10E6/uL (ref 3.77–5.28)
RDW: 13.1 % (ref 11.7–15.4)
WBC: 5.3 10*3/uL (ref 3.4–10.8)

## 2023-10-04 LAB — IGE NUT PROF. W/COMPONENT RFLX
F017-IgE Hazelnut (Filbert): <0.1 kU/L
F018-IgE Brazil Nut: <0.1 kU/L
F020-IgE Almond: <0.1 kU/L
F202-IgE Cashew Nut: <0.1 kU/L
F203-IgE Pistachio Nut: <0.1 kU/L
F256-IgE Walnut: <0.1 kU/L
Macadamia Nut, IgE: <0.1 kU/L
Peanut, IgE: <0.1 kU/L
Pecan Nut IgE: <0.1 kU/L

## 2023-10-04 LAB — THYROID ANTIBODIES
Thyroglobulin Antibody: <1 [IU]/mL (ref 0.0–0.9)
Thyroperoxidase Ab SerPl-aCnc: 95 [IU]/mL — ABNORMAL HIGH (ref 0–34)

## 2023-10-04 LAB — TRYPTASE: Tryptase: 2.7 ug/L (ref 2.2–13.2)

## 2023-10-04 LAB — CMP14+EGFR
ALT: 20 [IU]/L (ref 0–32)
AST: 22 [IU]/L (ref 0–40)
Albumin: 5 g/dL — ABNORMAL HIGH (ref 3.9–4.9)
Alkaline Phosphatase: 46 [IU]/L (ref 44–121)
BUN/Creatinine Ratio: 6 — ABNORMAL LOW (ref 9–23)
BUN: 5 mg/dL — ABNORMAL LOW (ref 6–20)
Bilirubin Total: 0.5 mg/dL (ref 0.0–1.2)
CO2: 23 mmol/L (ref 20–29)
Calcium: 9.6 mg/dL (ref 8.7–10.2)
Chloride: 100 mmol/L (ref 96–106)
Creatinine, Ser: 0.79 mg/dL (ref 0.57–1.00)
Globulin, Total: 2.4 g/dL (ref 1.5–4.5)
Glucose: 104 mg/dL — ABNORMAL HIGH (ref 70–99)
Potassium: 3.7 mmol/L (ref 3.5–5.2)
Sodium: 138 mmol/L (ref 134–144)
Total Protein: 7.4 g/dL (ref 6.0–8.5)
eGFR: 98 mL/min/{1.73_m2} (ref >59)

## 2023-10-04 LAB — C-REACTIVE PROTEIN: CRP: <1 mg/L (ref 0–10)

## 2023-10-04 LAB — SEDIMENTATION RATE: Sed Rate: 3 mm/h (ref 0–32)

## 2023-10-04 LAB — ALPHA-GAL PANEL
Allergen Lamb IgE: <0.1 kU/L
Beef IgE: <0.1 kU/L
IgE (Immunoglobulin E), Serum: 63 [IU]/mL (ref 6–495)
O215-IgE Alpha-Gal: <0.1 kU/L
Pork IgE: <0.1 kU/L

## 2023-10-04 LAB — CHRONIC URTICARIA: cu index: <1 (ref <10)

## 2023-10-07 ENCOUNTER — Other Ambulatory Visit: Payer: Self-pay

## 2023-10-07 ENCOUNTER — Other Ambulatory Visit (HOSPITAL_COMMUNITY): Payer: Self-pay

## 2023-10-07 ENCOUNTER — Telehealth: Payer: Self-pay | Admitting: *Deleted

## 2023-10-07 MED ORDER — OMALIZUMAB 150 MG/ML ~~LOC~~ SOSY
300.0000 mg | PREFILLED_SYRINGE | SUBCUTANEOUS | 11 refills | Status: DC
Start: 2023-10-07 — End: 2024-03-18
  Filled 2023-10-07: qty 2, 28d supply, fill #0
  Filled 2023-10-23: qty 2, 28d supply, fill #1
  Filled 2023-11-28: qty 2, 28d supply, fill #2
  Filled 2023-12-27: qty 2, 28d supply, fill #3

## 2023-10-07 NOTE — Telephone Encounter (Signed)
L/m for patient to contact me to advise approval copay card and submit to [facility] for Geoffry Paradise

## 2023-10-07 NOTE — Progress Notes (Signed)
Specialty Pharmacy Initial Fill Coordination Note  Meredith Burns is a 37 y.o. female contacted today regarding refills of specialty medication(s) Omalizumab   Patient requested Courier to Provider Office   Delivery date: 10/09/23   Verified address: [address] [place] [address]   Medication will be filled on 11/12.   Patient is aware of $0.00 copayment with Xolair copay card.

## 2023-10-07 NOTE — Telephone Encounter (Signed)
Spoke to patient and advised approval and submit to [facility] and will reach out once delivery set to make appt to start therapy

## 2023-10-07 NOTE — Telephone Encounter (Signed)
T/c to patient to advise approval and submit for Geoffry Paradise

## 2023-10-07 NOTE — Progress Notes (Signed)
Specialty Pharmacy Initiation Note   Meredith Burns is a 37 y.o. female who will be followed by the specialty pharmacy service for RxSp Allergy    Review of administration, indication, effectiveness, safety, potential side effects, storage/disposable, and missed dose instructions occurred today for patient's specialty medication(s) Omalizumab     Patient/Caregiver did not have any additional questions or concerns.   Patient's therapy is appropriate to: Initiate    Goals Addressed             This Visit's Progress    Reduce signs and symptoms       Patient is initiating therapy. Patient will maintain adherence and adhere to provider and/or lab appointments         Otto Herb Specialty Pharmacist

## 2023-10-08 ENCOUNTER — Other Ambulatory Visit: Payer: Self-pay

## 2023-10-08 ENCOUNTER — Telehealth: Payer: Self-pay

## 2023-10-08 MED ORDER — EPINEPHRINE 0.3 MG/0.3ML IJ SOAJ: 0.3000 mg | Freq: Once | INTRAMUSCULAR | 0 refills | Status: AC

## 2023-10-08 NOTE — Telephone Encounter (Signed)
L/m for pt delivery of xolair 11/13 and can make appt in [facility] for start after tomorrow

## 2023-10-08 NOTE — Telephone Encounter (Signed)
Pt needs epi-pen for Xolair injections sent into the pharmacy for her.

## 2023-10-08 NOTE — Telephone Encounter (Signed)
Thanks, Tammy.

## 2023-10-10 ENCOUNTER — Ambulatory Visit: Payer: BC Managed Care – PPO

## 2023-10-10 DIAGNOSIS — L508 Other urticaria: Secondary | ICD-10-CM

## 2023-10-10 MED ORDER — OMALIZUMAB 150 MG ~~LOC~~ SOLR
300.0000 mg | SUBCUTANEOUS | Status: DC
  Administered 2023-10-10 – 2024-01-02 (×5): 300 mg via SUBCUTANEOUS

## 2023-10-22 NOTE — Progress Notes (Signed)
We received [facility].  It looks like she was on Rinvoq for some period of time.  She was also on clindamycin as well as clobetasol and hydrocortisone.  She was diagnosed with atopic dermatitis and acne.  She did receive some Kenalog injections as well over the course of the time at this clinic.  She had a biopsy performed that showed atypical keratinocytes.  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  The final dermatopathology showed early eczematous dermatitis.  The results will be scanned into the system.

## 2023-10-23 ENCOUNTER — Other Ambulatory Visit: Payer: Self-pay

## 2023-10-23 NOTE — Progress Notes (Signed)
Specialty Pharmacy Refill Coordination Note  Meredith Burns is a 37 y.o. female contacted today regarding refills of specialty medication(s) Omalizumab   Patient requested Courier to Provider Office   Delivery date: 11/04/23   Verified address: [facility] [address] [place] [address]   Medication will be filled on 11/01/23.

## 2023-11-07 ENCOUNTER — Ambulatory Visit: Payer: BC Managed Care – PPO

## 2023-11-07 DIAGNOSIS — L501 Idiopathic urticaria: Secondary | ICD-10-CM

## 2023-11-28 ENCOUNTER — Other Ambulatory Visit: Payer: Self-pay

## 2023-11-28 ENCOUNTER — Other Ambulatory Visit (HOSPITAL_COMMUNITY): Payer: Self-pay

## 2023-11-28 NOTE — Progress Notes (Signed)
 Specialty Pharmacy Refill Coordination Note  Meredith Burns is a 38 y.o. female contacted today regarding refills of specialty medication(s) Omalizumab  (XOLAIR )   Patient requested Courier to Provider Office   Delivery date: 11/29/23   Verified address: [facility] [address] [place] [address]   Medication will be filled on 01.02.25.

## 2023-12-05 ENCOUNTER — Other Ambulatory Visit: Payer: Self-pay

## 2023-12-05 ENCOUNTER — Ambulatory Visit: Payer: BC Managed Care – PPO

## 2023-12-05 ENCOUNTER — Encounter: Payer: Self-pay | Admitting: Allergy

## 2023-12-05 ENCOUNTER — Ambulatory Visit: Payer: BC Managed Care – PPO | Admitting: Allergy

## 2023-12-05 VITALS — BP 114/68 | HR 89 | Temp 98.1°F | Resp 18

## 2023-12-05 DIAGNOSIS — L501 Idiopathic urticaria: Secondary | ICD-10-CM | POA: Diagnosis not present

## 2023-12-05 DIAGNOSIS — L282 Other prurigo: Secondary | ICD-10-CM

## 2023-12-05 DIAGNOSIS — L508 Other urticaria: Secondary | ICD-10-CM

## 2023-12-05 MED ORDER — METHYLPREDNISOLONE 4 MG PO TBPK: ORAL_TABLET | ORAL | 0 refills | Status: DC

## 2023-12-05 MED ORDER — CLOTRIMAZOLE-BETAMETHASONE 1-0.05 % EX CREA: 1.0000 | TOPICAL_CREAM | Freq: Two times a day (BID) | CUTANEOUS | 0 refills | Status: AC | PRN

## 2023-12-05 MED ORDER — HYDROXYZINE HCL 25 MG PO TABS: 25.0000 mg | ORAL_TABLET | Freq: Three times a day (TID) | ORAL | 1 refills | Status: AC | PRN

## 2023-12-05 NOTE — Progress Notes (Signed)
 Follow Up Note  RE: Meredith Burns MRN: [id number] DOB: [birth year] Date of Office Visit: 12/05/2023  Referring provider: Sophronia Ozell BROCKS, MD Primary care provider: Sophronia Ozell BROCKS, MD  Chief Complaint: Urticaria (Back, face, chest, arms - with dryness )  History of Present Illness: I had the pleasure of seeing Meredith Burns for a follow up visit at the [facility] on 12/05/2023. She is a 38 y.o. female, who is being followed for urticaria on Xolair , allergic rhinitis, food allergies. Her previous allergy  office visit was on 09/25/2023 with Dr. Iva. Today is a new complaint visit of rash.  Discussed the use of AI scribe software for clinical note transcription with the patient, who gave verbal consent to proceed.  The patient presents with a persistent, itchy rash that has been ongoing for several months. The rash, initially presenting as red bumps, has evolved in appearance and is now present on the arms, stomach, chest, and back. The patient reports that the rash is extremely itchy and has been applying over-the-counter cortisone and antifungal creams with no relief. She has also been taking Benadryl , which has not been effective in managing the symptoms.  The patient has received her third Xolair  injections today with no noticeable improvement in the rash. She has also previously received Kenalog  steroid shots from a dermatologist, which provided temporary relief from the rash for a couple of weeks at a time. However, the dermatologist has since recommended that the patient see an allergist due to other ongoing issues.  The patient has also been referred to a rheumatologist due to a positive ANA result. However, she has not been able to secure an appointment until several months from now. The patient denies any changes in diet, medications, or personal care products and uses dermatology recommended sensitive skin care products and free and clear laundry  detergent. She has dogs at home but denies any issues with fleas or insects. The patient denies any systemic symptoms such as fever, chills, changes in appetite, or difficulty breathing. Nobody else has this rash at home. Current rash on arms looks different than her hive rash.   Patient apparently had whole body itching after taking prednisone but the prednisone was used to treat itching. She had medrol  pak and prednisone and kenalog  injections before without any systemic anaphylactic reactions.     Assessment and Plan: Meredith Burns is a 38 y.o. female with: Pruritic rash Chronic urticaria Persistent rash with pruritus despite multiple treatments including Kenalog  injections, antifungal creams, and cortisone. Rash has evolved in appearance and is now ring-like in some areas. Patient has been on Xolair  for 3 months with no improvement. Dermatology diagnosed folliculitis and hives. Patient has a positive ANA and is awaiting rheumatology consultation. Current rash does not seem to be urticaria. Some areas consistent with possible fungal rash.  Patient requesting steroids due to extreme pruritus and unable to sleep however no longer on antihistamines.  Take hydroxyzine  25mg  every 8 hours as needed for itching - this may make you tired. Take medrol  pak as prescribed. For mild symptoms you can take over the counter antihistamines such as Benadryl  1-2 tablets = 25-50mg  and monitor symptoms closely. If symptoms worsen or if you have severe symptoms including breathing issues, throat closure, significant swelling, whole body hives, severe diarrhea and vomiting, lightheadedness then inject epinephrine  and seek immediate medical care afterwards. Confirmed with patient that she never had anaphylactic reactions to steroids. She did have itching but steroids were being used to  treat her itching as well.  May use topical lotrisone  cream as needed twice a day as needed. Encouraged patient to keep rheumatology  appointment when available. Continue Xolair  injections and re-evaluate with Dr. Iva in a month - given today.   Return in about 4 weeks (around 01/02/2024).  Meds ordered this encounter  Medications   hydrOXYzine  (ATARAX ) 25 MG tablet    Sig: Take 1 tablet (25 mg total) by mouth 3 (three) times daily as needed for itching.    Dispense:  30 tablet    Refill:  1   methylPREDNISolone  (MEDROL  DOSEPAK) 4 MG TBPK tablet    Sig: Take 6 tablets on day 1, 5 tablets on day 2, 4 tabs on day 3, 3 tabs on day 4, 2 tabs on day 5, 1 tab on day 6.    Dispense:  21 tablet    Refill:  0   clotrimazole -betamethasone  (LOTRISONE ) cream    Sig: Apply 1 Application topically 2 (two) times daily as needed (rash).    Dispense:  30 g    Refill:  0   Lab Orders  No laboratory test(s) ordered today    Diagnostics: None.   Medication List:  Current Outpatient Medications  Medication Sig Dispense Refill   acetaminophen  (TYLENOL ) 500 MG tablet Take 1,000 mg by mouth every 8 (eight) hours as needed for mild pain or moderate pain.     ALPRAZolam (XANAX) 0.5 MG tablet Take 0.5 mg by mouth 2 (two) times daily as needed for anxiety.     amLODipine (NORVASC) 5 MG tablet Take by mouth.     amphetamine-dextroamphetamine (ADDERALL) 30 MG tablet Take 1 tablet by mouth 2 (two) times daily.     cetirizine  (ZYRTEC ) 10 MG tablet Take 1 tablet (10 mg total) by mouth daily. 30 tablet 5   clindamycin (CLEOCIN T) 1 % external solution Apply 1 Application topically daily as needed.     clobetasol cream (TEMOVATE) 0.05 % Apply 1 Application topically 2 (two) times daily.     clonazePAM (KLONOPIN) 0.5 MG tablet Take 1 tablet by mouth 3 (three) times daily as needed.     clotrimazole -betamethasone  (LOTRISONE ) cream Apply 1 Application topically 2 (two) times daily as needed (rash). 30 g 0   cyclobenzaprine  (FLEXERIL ) 10 MG tablet Take by mouth.     hydrocortisone 2.5 % cream Apply to affected area 2 times daily      hydrOXYzine  (ATARAX ) 25 MG tablet Take 1 tablet (25 mg total) by mouth 3 (three) times daily as needed for itching. 30 tablet 1   levocetirizine (XYZAL ) 5 MG tablet Take 1 tablet (5 mg total) by mouth 2 (two) times daily. 30 tablet 5   methylPREDNISolone  (MEDROL  DOSEPAK) 4 MG TBPK tablet Take 6 tablets on day 1, 5 tablets on day 2, 4 tabs on day 3, 3 tabs on day 4, 2 tabs on day 5, 1 tab on day 6. 21 tablet 0   omalizumab  (XOLAIR ) 150 MG/ML prefilled syringe Inject 300 mg into the skin every 28 (twenty-eight) days. 2 mL 11   sertraline (ZOLOFT) 100 MG tablet Take 1 tablet by mouth daily.     tretinoin (RETIN-A) 0.05 % cream apply to face at bedtime as directed     triamcinolone  cream (KENALOG ) 0.1 % Apply topically 2 (two) times daily as needed.     promethazine  (PHENERGAN ) 25 MG tablet Take 1 tablet (25 mg total) by mouth every 6 (six) hours as needed for nausea. 30 tablet 0  Current Facility-Administered Medications  Medication Dose Route Frequency Provider Last Rate Last Admin   0.9 %  sodium chloride  infusion  500 mL Intravenous Once Abran Norleen SAILOR, MD       omalizumab  (XOLAIR ) injection 300 mg  300 mg Subcutaneous Q28 days Iva Marty Saltness, MD   300 mg at 12/05/23 1037   Allergies: Allergies  Allergen Reactions   Peanut-Containing Drug Products Anaphylaxis   Prednisone Anaphylaxis   Aspirin Swelling   Bee Venom    Cefaclor Diarrhea and Nausea And Vomiting   Chocolate Swelling   Morphine  And Codeine Hives   Nasal Spray Swelling    Face turns red and swells up   Ondansetron Hcl Other (See Comments)    Tremors    Iodine Rash   Latex Rash   I reviewed her past medical history, social history, family history, and environmental history and no significant changes have been reported from her previous visit.  Review of Systems  Constitutional:  Negative for appetite change, chills, fever and unexpected weight change.  HENT:  Negative for congestion and rhinorrhea.   Eyes:   Negative for itching.  Respiratory:  Negative for cough, chest tightness, shortness of breath and wheezing.   Cardiovascular:  Negative for chest pain.  Gastrointestinal:  Negative for abdominal pain.  Genitourinary:  Negative for difficulty urinating.  Skin:  Positive for rash.       itching  Neurological:  Negative for headaches.    Objective: BP 114/68 (Cuff Size: Normal)   Pulse 89   Temp 98.1 F (36.7 C)   Resp 18   LMP 11/14/2013   SpO2 97%  There is no height or weight on file to calculate BMI. Physical Exam Vitals and nursing note reviewed.  Constitutional:      Appearance: She is well-developed.  HENT:     Head: Normocephalic and atraumatic.     Right Ear: Tympanic membrane and external ear normal.     Left Ear: Tympanic membrane and external ear normal.     Nose: Nose normal.     Mouth/Throat:     Mouth: Mucous membranes are moist.     Pharynx: Oropharynx is clear.  Eyes:     Conjunctiva/sclera: Conjunctivae normal.  Cardiovascular:     Rate and Rhythm: Normal rate and regular rhythm.     Heart sounds: Normal heart sounds. No murmur heard.    No friction rub. No gallop.  Pulmonary:     Effort: Pulmonary effort is normal.     Breath sounds: Normal breath sounds. No wheezing, rhonchi or rales.  Musculoskeletal:     Cervical back: Neck supple.  Skin:    General: Skin is warm.     Findings: Rash present.     Comments: Scattered excoriation marks on upper extremities. Left upper extremity on dorsal aspect below the elbow are - 2 circular erythematous patch with clear demarcated circular line.  Neurological:     Mental Status: She is alert and oriented to person, place, and time.  Psychiatric:        Behavior: Behavior normal.    Previous notes and tests were reviewed. The plan was reviewed with the patient/family, and all questions/concerned were addressed.  It was my pleasure to see Meredith Burns today and participate in her care. Please feel free to contact  me with any questions or concerns.  Sincerely,  Orlan Cramp, DO Allergy  & Immunology  [facility]  [place] office: [phone] [facility] office: [phone]

## 2023-12-05 NOTE — Patient Instructions (Addendum)
 Rash Etiology unclear. Xolair  - given today. Take hydroxyzine  25mg  every 8 hours as needed for itching - this may make you tired. Take medrol  pak as prescribed. For mild symptoms you can take over the counter antihistamines such as Benadryl  1-2 tablets = 25-50mg  and monitor symptoms closely. If symptoms worsen or if you have severe symptoms including breathing issues, throat closure, significant swelling, whole body hives, severe diarrhea and vomiting, lightheadedness then inject epinephrine  and seek immediate medical care afterwards. May use topical lotrisone  cream as needed twice a day.  Please follow up with DR. GALLAGHER to discuss further treatment options.

## 2023-12-06 ENCOUNTER — Ambulatory Visit: Payer: BC Managed Care – PPO | Admitting: Allergy & Immunology

## 2023-12-18 ENCOUNTER — Ambulatory Visit: Payer: BC Managed Care – PPO | Admitting: Family Medicine

## 2023-12-20 ENCOUNTER — Other Ambulatory Visit (HOSPITAL_COMMUNITY): Payer: Self-pay

## 2023-12-25 ENCOUNTER — Other Ambulatory Visit (HOSPITAL_COMMUNITY): Payer: Self-pay

## 2023-12-27 ENCOUNTER — Other Ambulatory Visit (HOSPITAL_COMMUNITY): Payer: Self-pay

## 2023-12-27 ENCOUNTER — Other Ambulatory Visit: Payer: Self-pay

## 2023-12-27 NOTE — Progress Notes (Signed)
Specialty Pharmacy Refill Coordination Note  Meredith Burns is a 38 y.o. female contacted today regarding refills of specialty medication(s) Omalizumab Geoffry Paradise)   Patient requested Courier to Provider Office   Delivery date: 12/30/23   Verified address: [facility] [address]  [place],  [place] [address]   Medication will be filled on 12/27/23.

## 2024-01-02 ENCOUNTER — Ambulatory Visit: Payer: BC Managed Care – PPO

## 2024-01-02 DIAGNOSIS — L508 Other urticaria: Secondary | ICD-10-CM

## 2024-01-17 ENCOUNTER — Ambulatory Visit: Payer: BC Managed Care – PPO | Admitting: Allergy & Immunology

## 2024-01-22 ENCOUNTER — Telehealth: Payer: Self-pay | Admitting: Allergy & Immunology

## 2024-01-22 ENCOUNTER — Encounter: Payer: Self-pay | Admitting: Allergy & Immunology

## 2024-01-22 ENCOUNTER — Other Ambulatory Visit: Payer: Self-pay

## 2024-01-22 ENCOUNTER — Ambulatory Visit (INDEPENDENT_AMBULATORY_CARE_PROVIDER_SITE_OTHER): Payer: BC Managed Care – PPO | Admitting: Allergy & Immunology

## 2024-01-22 VITALS — BP 102/70 | HR 93 | Temp 98.3°F | Resp 14

## 2024-01-22 DIAGNOSIS — R221 Localized swelling, mass and lump, neck: Secondary | ICD-10-CM

## 2024-01-22 DIAGNOSIS — L501 Idiopathic urticaria: Secondary | ICD-10-CM

## 2024-01-22 DIAGNOSIS — R220 Localized swelling, mass and lump, head: Secondary | ICD-10-CM

## 2024-01-22 MED ORDER — CYCLOSPORINE 25 MG PO CAPS: 75.0000 mg | ORAL_CAPSULE | Freq: Two times a day (BID) | ORAL | 1 refills | Status: AC

## 2024-01-22 MED ORDER — TRIAMCINOLONE ACETONIDE 0.1 % EX CREA: TOPICAL_CREAM | Freq: Two times a day (BID) | CUTANEOUS | 0 refills | Status: DC | PRN

## 2024-01-22 NOTE — Progress Notes (Unsigned)
 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 Chronic urticaria - Labs were notable for the positive ANA (marked or autoimmunity) and elevated anti-thyroid antibodies. - We are going to check your thyroid function to make sure that is normal. - We are also going to check the tryptase again to make sure that this is still normal (because of your redness you are experiencing).  - Since the Xolair did not work, we are going to start cyclosporine instead. - Start cyclosporine 75 mg twice daily.  - This can affect your kidneys and blood pressure and complete blood counts.  - We are going to check this every week to make sure they are looking good. - I will send in triamcinolone to use twice daily.  - In the meantime, continue suppressive dosing of antihistamines:   - Morning: Zyrtec (cetirizine) 10mg  (one tablet)  - Evening: Xyzal (levocetirizine) 10mg  (two tablets) + Doxepin 50mg  - You can change this dosing at home, decreasing the dose as needed or increasing the dosing as needed.   2. Tree nut allergy - We will get testing to look for tree nut allergies.  3. Seasonal and perennial allergic rhinitis - Testing today at the last visit: grasses, weeds, trees, outdoor molds, and cat - Continue taking: antihistamine regimen as above - You can use an extra dose of the antihistamine, if needed, for breakthrough symptoms.  - Consider nasal saline rinses 1-2 times daily to remove allergens from the nasal cavities as well as help with mucous clearance  (this is especially helpful to do before the nasal sprays are given)  4. Return in about 4 weeks (around 02/19/2024). You can have the follow up appointment with Dr. Dellis Anes or a Nurse Practicioner (our Nurse Practitioners are excellent and always have Physician oversight!).   Subjective:   Meredith Burns is a 38 y.o. female presenting today for follow up of  Chief Complaint  Patient presents with   Follow-up    Meredith Burns has a history of the following: Patient Active Problem List   Diagnosis Date Noted   Encounter for screening colonoscopy 02/04/2020   Attention deficit hyperactivity disorder (ADHD), combined type 10/28/2018   Family history of leukemia 10/28/2018   GAD (generalized anxiety disorder) 10/28/2018   Moderate episode of recurrent major depressive disorder (HCC) 10/28/2018   Radial scar of breast 04/15/2018   Breast cyst, left 12/25/2017   Monoallelic mutation of ATM gene 16/08/9603   S/P laparoscopic hysterectomy 10/04/2014   Constipation 10/01/2013   Nausea with vomiting 10/01/2013   Abdominal pain 10/01/2013   BACK PAIN 09/22/2007   HEADACHE, CHRONIC 09/22/2007   ATTENTION DEFICIT DISORDER, HX OF 09/22/2007   ADRENAL INSUFFICIENCY, HX OF 09/22/2007   SYMPTOM, PAIN, ABDOMINAL, GENERALIZED 08/18/2007   Generalized abdominal pain 08/18/2007   ACNE, MILD 02/03/2007   WEIGHT GAIN 02/03/2007   Acne, mild 02/03/2007   HEMATURIA 12/24/2006   SYNCOPE 12/24/2006   VOMITING 12/24/2006   FLANK PAIN, LEFT 12/24/2006   NARCOTIC ABUSE 12/13/2006   DISORDER, DEPRESSIVE NEC 12/13/2006    History obtained  from: chart review and patient.  Discussed the use of AI scribe software for clinical note transcription with the patient and/or guardian, who gave verbal consent to proceed.  Meredith Burns is a 38 y.o. female presenting for a follow up visit.  She was last seen in January 2025 by Dr. Selena Batten.  At that time, she was continued to have rash despite being on Xolair for  3 months.  She had been diagnosed with folliculitis and hives.  Since the last visit, she has done well.   She has persistent skin rashes that have worsened over time, appearing as spots resembling ringworm and causing significant pruritus. The rashes leave scars and are primarily located on her arms, chest, and thighs. Triamcinolone reduces erythema but not the papules. Various treatments, including topical steroids and antifungal medications, have not provided significant relief. A steroid pack prescribed two weeks ago offered temporary relief, but symptoms returned after completion. No fever, gastrointestinal symptoms, or blood pressure issues. She reports ecchymosis on her thighs and persistent skin erythema.  She has a history of a positive ANA test and is scheduled to see Rheumatology in April. She experiences hot flashes, irritability, and weight gain, which she associates with her current condition. No gastrointestinal symptoms such as diarrhea, vomiting, or abdominal pain. She denies any known allergies to metals or jewelry.  Her current medications include antihistamines and Xolair, which have not provided significant relief. She has not been on prednisone frequently. She uses triamcinolone cream for her neck and chest, which reduces erythema but not the papules. Zoloft has been discontinued as it is not providing benefit.  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 She vapes and has done so for a long time. She is meticulous about cleaning her house and wears long sleeves to avoid potential irritants from her dogs.     Otherwise, there have been no changes to her past medical history, surgical history, family history, or social history.    Review of systems otherwise negative other than that mentioned in the HPI.    Objective:   Blood pressure 102/70, pulse 93, temperature 98.3 F (36.8 C), resp. rate 14, last menstrual period  11/14/2013, SpO2 98%. There is no height or weight on file to calculate BMI.    Physical Exam Vitals reviewed.  Constitutional:      Appearance: She is well-developed.     Comments: Anxious.   HENT:     Head: Normocephalic and atraumatic.     Right Ear: Tympanic membrane, ear canal and external ear normal. No drainage, swelling or tenderness. Tympanic membrane is not injected, scarred, erythematous, retracted or bulging.     Left Ear: Tympanic membrane, ear canal and external ear normal. No drainage, swelling or tenderness. Tympanic membrane is not injected, scarred, erythematous, retracted or bulging.     Nose: No nasal deformity, septal deviation, mucosal edema or rhinorrhea.     Right Turbinates: Enlarged, swollen and pale.     Left Turbinates: Enlarged, swollen and pale.     Right Sinus: No maxillary sinus tenderness or frontal sinus tenderness.     Left Sinus: No maxillary sinus tenderness or frontal sinus tenderness.     Mouth/Throat:     Lips: Pink.     Mouth: Mucous membranes are moist. Mucous membranes are not pale and not dry.     Pharynx: Uvula midline.     Comments: No nasal polyps noted.  Eyes:     General:  Right eye: No discharge.        Left eye: No discharge.     Conjunctiva/sclera: Conjunctivae normal.     Right eye: Right conjunctiva is not injected. No chemosis.    Left eye: Left conjunctiva is not injected. No chemosis.    Pupils: Pupils are equal, round, and reactive to light.  Cardiovascular:     Rate and Rhythm: Normal rate and regular rhythm.     Heart sounds: Normal heart sounds.  Pulmonary:     Effort: Pulmonary effort is normal. No tachypnea, accessory muscle usage or respiratory distress.     Breath sounds: Normal breath sounds. No wheezing, rhonchi or rales.  Chest:     Chest wall: No tenderness.  Abdominal:     Tenderness: There is no abdominal tenderness. There is no guarding or rebound.  Lymphadenopathy:     Head:     Right side of  head: No submandibular, tonsillar or occipital adenopathy.     Left side of head: No submandibular, tonsillar or occipital adenopathy.     Cervical: No cervical adenopathy.  Skin:    General: Skin is warm.     Capillary Refill: Capillary refill takes less than 2 seconds.     Coloration: Skin is not pale.     Findings: Erythema and rash present. No abrasion or petechiae. Rash is not papular, urticarial or vesicular.     Comments: There is confluent erythema on the face as well as the neck, appearing almost as a sun burn. She does have some papules on the rest of her body with excoriations present as well.   Neurological:     General: No focal deficit present.     Mental Status: She is alert.  Psychiatric:        Behavior: Behavior is cooperative.      Diagnostic studies: none (ordered weekly metabolic panels)       Malachi Bonds, MD  [facility]

## 2024-01-22 NOTE — Patient Instructions (Addendum)
 1. Chronic urticaria - Labs were notable for the positive ANA (marked or autoimmunity) and elevated anti-thyroid antibodies. - We are going to check your thyroid function to make sure that is normal. - We are also going to check the tryptase again to make sure that this is still normal (because of your redness you are experiencing).  - Since the Xolair did not work, we are going to start cyclosporine instead. - Start cyclosporine 75 mg twice daily.  - This can affect your kidneys and blood pressure and complete blood counts.  - We are going to check this every week to make sure they are looking good. - I will send in triamcinolone to use twice daily.  - In the meantime, continue suppressive dosing of antihistamines:   - Morning: Zyrtec (cetirizine) 10mg  (one tablet)  - Evening: Xyzal (levocetirizine) 10mg  (two tablets) + Doxepin 50mg  - You can change this dosing at home, decreasing the dose as needed or increasing the dosing as needed.   2. Concern for contact dermatitis - We should consider doing patch testing to look for chemical sensitivities. - This would look for a number of different metal and chemical allergies and might point to something that you are eating or being exposed to.   - Make this appointment in 2-3 weeks (this is a three visit appointment where the patches are placed on a Monday and then read on a Wednesday and Friday.   3. Seasonal and perennial allergic rhinitis - Testing at the last visit: grasses, weeds, trees, outdoor molds, and cat - Continue taking: antihistamine regimen as above - You can use an extra dose of the antihistamine, if needed, for breakthrough symptoms.  - Consider nasal saline rinses 1-2 times daily to remove allergens from the nasal cavities as well as help with mucous clearance (this is especially helpful to do before the nasal sprays are given)  4. Return in about 4 weeks (around 02/19/2024). You can have the follow up appointment with Dr.  Dellis Anes or a Nurse Practicioner (our Nurse Practitioners are excellent and always have Physician oversight!).    Please inform us of any Emergency Department visits, hospitalizations, or changes in symptoms. Call us before going to the ED for breathing or allergy symptoms since we might be able to fit you in for a sick visit. Feel free to contact us anytime with any questions, problems, or concerns.  It was a pleasure to see you again today!  Websites that have reliable patient information: 1. American Academy of Asthma, Allergy, and Immunology: www.aaaai.org 2. Food Allergy Research and Education (FARE): foodallergy.org 3. Mothers of Asthmatics: http://www.asthmacommunitynetwork.org 4. American College of Allergy, Asthma, and Immunology: www.acaai.org      "Like" [place] on Facebook and Instagram for our latest updates!      A healthy democracy works best when [employer] participate! Make sure you are registered to vote! If you have moved or changed any of your contact information, you will need to get this updated before voting! Scan the QR codes below to learn more!

## 2024-01-22 NOTE — Telephone Encounter (Signed)
 Pharmacy called needed clarification on the day supply for medication triamcinolone cream (KENALOG) 0.1 % [[id number]]

## 2024-01-22 NOTE — Telephone Encounter (Signed)
 Sent in rx with 6 g for use daily

## 2024-01-24 ENCOUNTER — Encounter: Payer: Self-pay | Admitting: Allergy & Immunology

## 2024-01-24 ENCOUNTER — Other Ambulatory Visit: Payer: Self-pay

## 2024-01-24 LAB — TRYPTASE: Tryptase: 4.4 ug/L (ref 2.2–13.2)

## 2024-01-24 LAB — THYROID CASCADE PROFILE: TSH: 1.22 u[IU]/mL (ref 0.450–4.500)

## 2024-01-27 ENCOUNTER — Other Ambulatory Visit: Payer: Self-pay

## 2024-01-27 ENCOUNTER — Telehealth: Payer: Self-pay

## 2024-01-27 NOTE — Telephone Encounter (Signed)
-----   Message from Alfonse Spruce sent at 01/24/2024 11:06 AM EST ----- Can someone call the patient's PCP to see if the PCP can get weekly CMPs and weekly BP checks? This would be closer for the patient. We are starting cyclosporine. Thanks!

## 2024-01-27 NOTE — Telephone Encounter (Signed)
 Called patient's PCP - [facility]/Marsha Tedrow, [phone] - spoke to J[place]att, [facility] - DOB verified -advised of provider's notation below.  Morrie Sheldon stated she will delivery the message to the provider - if there's any issues the assistant will contact our office.  Forwarding the updated message to provider.

## 2024-01-29 ENCOUNTER — Encounter: Payer: Self-pay | Admitting: Allergy & Immunology

## 2024-01-30 ENCOUNTER — Ambulatory Visit: Payer: BC Managed Care – PPO

## 2024-02-19 ENCOUNTER — Ambulatory Visit: Payer: BC Managed Care – PPO | Admitting: Allergy & Immunology

## 2024-02-26 ENCOUNTER — Encounter: Payer: Self-pay | Admitting: Allergy & Immunology

## 2024-03-03 NOTE — Progress Notes (Unsigned)
 Office Visit Note  Patient: Meredith Burns             Date of Birth: May 25, 1986           MRN: [id number]             PCP: April Manson, NP Referring: Alfonse Spruce, * Visit Date: 03/04/2024 Occupation: @GUAROCC @  Subjective:  No chief complaint on file.   History of Present Illness: Ariane Ditullio is a 38 y.o. female ***     Activities of Daily Living:  Patient reports morning stiffness for *** {minute/hour:19697}.   Patient {ACTIONS;DENIES/REPORTS:21021675::"Denies"} nocturnal pain.  Difficulty dressing/grooming: {ACTIONS;DENIES/REPORTS:21021675::"Denies"} Difficulty climbing stairs: {ACTIONS;DENIES/REPORTS:21021675::"Denies"} Difficulty getting out of chair: {ACTIONS;DENIES/REPORTS:21021675::"Denies"} Difficulty using hands for taps, buttons, cutlery, and/or writing: {ACTIONS;DENIES/REPORTS:21021675::"Denies"}  No Rheumatology ROS completed.   PMFS History:  Patient Active Problem List   Diagnosis Date Noted   Encounter for screening colonoscopy 02/04/2020   Attention deficit hyperactivity disorder (ADHD), combined type 10/28/2018   Family history of leukemia 10/28/2018   GAD (generalized anxiety disorder) 10/28/2018   Moderate episode of recurrent major depressive disorder (HCC) 10/28/2018   Radial scar of breast 04/15/2018   Breast cyst, left 12/25/2017   Monoallelic mutation of ATM gene 86/57/8469   S/P laparoscopic hysterectomy 10/04/2014   Constipation 10/01/2013   Nausea with vomiting 10/01/2013   Abdominal pain 10/01/2013   BACK PAIN 09/22/2007   HEADACHE, CHRONIC 09/22/2007   ATTENTION DEFICIT DISORDER, HX OF 09/22/2007   ADRENAL INSUFFICIENCY, HX OF 09/22/2007   SYMPTOM, PAIN, ABDOMINAL, GENERALIZED 08/18/2007   Generalized abdominal pain 08/18/2007   ACNE, MILD 02/03/2007   WEIGHT GAIN 02/03/2007   Acne, mild 02/03/2007   HEMATURIA 12/24/2006   SYNCOPE 12/24/2006   VOMITING 12/24/2006   FLANK PAIN, LEFT 12/24/2006   NARCOTIC  ABUSE 12/13/2006   DISORDER, DEPRESSIVE NEC 12/13/2006    Past Medical History:  Diagnosis Date   Anemia    Anxiety    Cancer (HCC) 2019   right breast lumpectomy   Chronic headaches    Depression    Diverticulosis    Eczema    Gastritis    GERD (gastroesophageal reflux disease)    Hiatal hernia    Hypokalemia    Hypotension, postural    IBS (irritable bowel syndrome)    Irritable bowel syndrome (IBS)    Pneumonia    Tachycardia     Family History  Problem Relation Age of Onset   Urticaria Mother    Eczema Mother    Asthma Mother    Allergic rhinitis Mother    Irritable bowel syndrome Mother    Colon polyps Mother    Colon cancer Father 45   Colon polyps Father    Heart disease Father    COPD Father    Lung cancer Maternal Grandmother    Breast cancer Maternal Grandmother    Colon polyps Paternal Grandfather    Past Surgical History:  Procedure Laterality Date   CESAREAN SECTION     x 3    CHOLECYSTECTOMY     COSMETIC SURGERY Right    glass in leg-plastic surgery   MASTECTOMY, PARTIAL Right    Social History   Social History Narrative   Not on file    There is no immunization history on file for this patient.   Objective: Vital Signs: LMP 11/14/2013    Physical Exam   Musculoskeletal Exam: ***  CDAI Exam: CDAI Score: -- Patient Global: --; Provider Global: -- Swollen: --; Tender: --  Joint Exam 03/04/2024   No joint exam has been documented for this visit   There is currently no information documented on the homunculus. Go to the Rheumatology activity and complete the homunculus joint exam.  Investigation: No additional findings.  Imaging: No results found.  Recent Labs: Lab Results  Component Value Date   WBC 5.3 09/25/2023   HGB 13.0 09/25/2023   PLT 292 09/25/2023   NA 138 09/25/2023   K 3.7 09/25/2023   CL 100 09/25/2023   CO2 23 09/25/2023   GLUCOSE 104 (H) 09/25/2023   BUN 5 (L) 09/25/2023   CREATININE 0.79 09/25/2023    BILITOT 0.5 09/25/2023   ALKPHOS 46 09/25/2023   AST 22 09/25/2023   ALT 20 09/25/2023   PROT 7.4 09/25/2023   ALBUMIN 5.0 (H) 09/25/2023   CALCIUM 9.6 09/25/2023   GFRAA >60 02/27/2016    Speciality Comments: No specialty comments available.  Procedures:  No procedures performed Allergies: Peanut-containing drug products, Prednisone, Aspirin, Bee venom, Cefaclor, Chocolate, Morphine and codeine, Nasal spray, Ondansetron hcl, Iodine, and Latex   Assessment / Plan:     Visit Diagnoses: No diagnosis found.  Orders: No orders of the defined types were placed in this encounter.  No orders of the defined types were placed in this encounter.   Face-to-face time spent with patient was *** minutes. Greater than 50% of time was spent in counseling and coordination of care.  Follow-Up Instructions: No follow-ups on file.   Fuller Plan, MD  Note - This record has been created using [employer].  Chart creation errors have been sought, but may not always  have been located. Such creation errors do not reflect on  the standard of medical care.

## 2024-03-04 ENCOUNTER — Encounter: Payer: Self-pay | Admitting: Internal Medicine

## 2024-03-04 ENCOUNTER — Ambulatory Visit: Payer: BC Managed Care – PPO | Attending: Internal Medicine | Admitting: Internal Medicine

## 2024-03-04 VITALS — BP 109/70 | HR 92 | Resp 14 | Ht 66.0 in | Wt 140.0 lb

## 2024-03-04 DIAGNOSIS — R768 Other specified abnormal immunological findings in serum: Secondary | ICD-10-CM

## 2024-03-04 DIAGNOSIS — R208 Other disturbances of skin sensation: Secondary | ICD-10-CM

## 2024-03-04 DIAGNOSIS — R21 Rash and other nonspecific skin eruption: Secondary | ICD-10-CM

## 2024-03-04 DIAGNOSIS — M222X9 Patellofemoral disorders, unspecified knee: Secondary | ICD-10-CM | POA: Insufficient documentation

## 2024-03-04 DIAGNOSIS — M222X2 Patellofemoral disorders, left knee: Secondary | ICD-10-CM

## 2024-03-04 DIAGNOSIS — E559 Vitamin D deficiency, unspecified: Secondary | ICD-10-CM | POA: Diagnosis not present

## 2024-03-04 DIAGNOSIS — M222X1 Patellofemoral disorders, right knee: Secondary | ICD-10-CM | POA: Diagnosis not present

## 2024-03-05 LAB — ANTI-DNA ANTIBODY, DOUBLE-STRANDED: ds DNA Ab: 1 [IU]/mL

## 2024-03-05 LAB — C3 AND C4
C3 Complement: 116 mg/dL (ref 83–193)
C4 Complement: 20 mg/dL (ref 15–57)

## 2024-03-05 LAB — SJOGRENS SYNDROME-A EXTRACTABLE NUCLEAR ANTIBODY: SSA (Ro) (ENA) Antibody, IgG: <1 AI

## 2024-03-05 LAB — VITAMIN D 25 HYDROXY (VIT D DEFICIENCY, FRACTURES): Vit D, 25-Hydroxy: 42 ng/mL (ref 30–100)

## 2024-03-05 LAB — IRON,TIBC AND FERRITIN PANEL
%SAT: 18 % (ref 16–45)
Ferritin: 37 ng/mL (ref 16–154)
Iron: 68 ug/dL (ref 40–190)
TIBC: 369 ug/dL (ref 250–450)

## 2024-03-05 LAB — SJOGRENS SYNDROME-B EXTRACTABLE NUCLEAR ANTIBODY: SSB (La) (ENA) Antibody, IgG: <1 AI

## 2024-03-05 LAB — ANTI-SCLERODERMA ANTIBODY: Scleroderma (Scl-70) (ENA) Antibody, IgG: <1 AI

## 2024-03-05 LAB — RNP ANTIBODY: Ribonucleic Protein(ENA) Antibody, IgG: <1 AI

## 2024-03-05 LAB — ANTI-SMITH ANTIBODY: ENA SM Ab Ser-aCnc: <1 AI

## 2024-03-06 ENCOUNTER — Encounter: Payer: Self-pay | Admitting: Internal Medicine

## 2024-03-18 ENCOUNTER — Encounter: Payer: Self-pay | Admitting: Allergy & Immunology

## 2024-03-18 ENCOUNTER — Ambulatory Visit: Admitting: Allergy & Immunology

## 2024-03-18 VITALS — BP 110/76 | HR 89 | Temp 98.2°F | Resp 18

## 2024-03-18 DIAGNOSIS — J302 Other seasonal allergic rhinitis: Secondary | ICD-10-CM

## 2024-03-18 DIAGNOSIS — L501 Idiopathic urticaria: Secondary | ICD-10-CM | POA: Diagnosis not present

## 2024-03-18 DIAGNOSIS — R768 Other specified abnormal immunological findings in serum: Secondary | ICD-10-CM | POA: Diagnosis not present

## 2024-03-18 DIAGNOSIS — R221 Localized swelling, mass and lump, neck: Secondary | ICD-10-CM

## 2024-03-18 DIAGNOSIS — J3089 Other allergic rhinitis: Secondary | ICD-10-CM

## 2024-03-18 DIAGNOSIS — R220 Localized swelling, mass and lump, head: Secondary | ICD-10-CM

## 2024-03-18 DIAGNOSIS — B999 Unspecified infectious disease: Secondary | ICD-10-CM

## 2024-03-18 MED ORDER — DUPILUMAB 300 MG/2ML ~~LOC~~ SOSY: 300.0000 mg | PREFILLED_SYRINGE | SUBCUTANEOUS | Status: DC

## 2024-03-18 MED ORDER — DUPILUMAB 300 MG/2ML ~~LOC~~ SOSY
300.0000 mg | PREFILLED_SYRINGE | SUBCUTANEOUS | Status: AC
  Administered 2024-03-18: 300 mg via SUBCUTANEOUS

## 2024-03-18 NOTE — Progress Notes (Signed)
 Immunotherapy   Patient Details  Name: Meredith Burns MRN: [id number] Date of Birth: 1985-12-05  03/18/2024  Meredith Burns started injections for  Dupixent  Following schedule: Every fourteen days Frequency:every two weeks.  Epi-Pen:Not needed.  Consent signed in office today and patient instructions given. Patient waited in room four for fifteen minutes without an issue.     Meredith Burns 03/18/2024, 10:20 AM

## 2024-03-18 NOTE — Progress Notes (Signed)
 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 Chronic urticaria - Previous work up was normal aside from from the anti thyroid  antibodies.  - Dupixent  loading dose provided today.  - Meredith Burns will call you about the approval process. .  - In the meantime, continue suppressive dosing of antihistamines:   - Morning: Zyrtec  (cetirizine ) 10mg  (one tablet)  - Evening: Zyrtec  (cetirizine ) 10mg  (one tablet) - We are going to schedule you for patch testing to look for sensitizations to chemicals that might be causing your symptoms.  - These are placed on a Monday and then read on a Wednesday and a Friday.  - Call us  about the pads that you want refilled.  - Your hives are not run of the mill, so I am going to order an immune screen to make sure that we are not missing anything.   2. Seasonal and perennial allergic rhinitis - Testing in the past has showed: grasses, weeds, trees, outdoor molds, and cat - Copy of test results provided.  - Avoidance measures provided.  - You can use an extra dose of the antihistamine, if needed, for breakthrough symptoms.  - Consider nasal saline rinses 1-2 times daily to remove allergens from the nasal cavities as well as help with mucous clearance (this is especially helpful to do before the nasal sprays are given).  3. Return in about 4 weeks (around  04/15/2024). You can have the follow up appointment with Dr. Idolina Burns or a Nurse Practicioner (our Nurse Practitioners are excellent and always have Physician oversight!).    Subjective:   Meredith Burns is a 38 y.o. female presenting today for follow up of  Chief Complaint  Patient presents with   Follow-up    Meredith Burns has a history of the following: Patient Active Problem List   Diagnosis Date Noted   Patellofemoral pain syndrome 03/04/2024   Positive ANA (antinuclear antibody) 03/04/2024   Rash and other nonspecific skin eruption 03/04/2024   Vitamin D  deficiency 03/04/2024   Encounter for screening colonoscopy 02/04/2020   Attention deficit hyperactivity disorder (ADHD), combined type 10/28/2018   Family history of leukemia 10/28/2018   GAD (generalized anxiety disorder) 10/28/2018   Moderate episode of recurrent major depressive disorder (HCC) 10/28/2018   Radial scar of breast 04/15/2018   Breast cyst, left 12/25/2017   Monoallelic mutation of ATM gene 30/86/5784   S/P laparoscopic hysterectomy 10/04/2014   Constipation 10/01/2013   Nausea with vomiting 10/01/2013   Abdominal pain 10/01/2013   BACK PAIN 09/22/2007   HEADACHE, CHRONIC 09/22/2007   ATTENTION DEFICIT DISORDER, HX OF 09/22/2007   ADRENAL INSUFFICIENCY, HX OF 09/22/2007   SYMPTOM, PAIN, ABDOMINAL, GENERALIZED 08/18/2007   Generalized abdominal pain 08/18/2007   ACNE, MILD 02/03/2007   WEIGHT GAIN 02/03/2007   Acne, mild 02/03/2007   HEMATURIA 12/24/2006   SYNCOPE 12/24/2006   VOMITING 12/24/2006   FLANK PAIN, LEFT  12/24/2006   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  Discussed the use of AI scribe software for clinical note transcription with the patient and/or guardian, who gave verbal consent to proceed.  Meredith Burns is a 38 y.o. female presenting for a follow up visit.  She was last seen in February 2025.  At  that time, she had labs that were notable for an elevated ANA and antithyroid antibodies.  We checked her thyroid  function and this was normal.  We checked a tryptase again which was normal.  Xolair  was not working, so we started cyclosporine .  We started at 75 mg twice daily.  We continued with Zyrtec  10 mg in the morning and Xyzal  10 mg at night with doxepin  50 mg.  We obtained labs to look for tree nut allergy .  For her allergic rhinitis, we continue with her antihistamines.  In the interim, she has continued to have some issues with redness and itching.   She has experienced persistent hives and skin rashes for several years, with worsening over the past five to six years. The rashes are red, itchy spots that appear on her chest, arms, and occasionally legs, starting as small red marks and developing into larger, itchy patches. They sometimes appear 'flower-like' with a white spot in the center.  She has tried various treatments including Xolair , which initially reduced itching but was ultimately ineffective, and Rinvoq, which caused severe headaches without alleviating symptoms. Prednisone has been effective in reducing itching and helping the rashes fade, but new spots continue to appear. She uses topical doxycycline wipes for temporary relief and has been on various antihistamines including Zyrtec , Claritin, and Benadryl , with minimal relief. She discontinued Xyzal  and doxepin  due to ineffectiveness and side effects such as nausea.  She has a history of breast cancer and an ATM mutation identified during her cancer workup. 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  She is highly allergic to environmental factors such as grass and takes precautions like washing with alcohol wipes and using natural products to avoid allergens. Despite these measures, she has not noticed significant improvement. She does not  consume tree nuts and wears protective clothing when outdoors. She reports a history of environmental allergies with more coughing than sneezing related to her allergies.   No frequent infections such as pneumonia or sinus infections. She tends to be the "giver" [of infections] in her household, but does not necessarily get sick herself.    Otherwise, there have been no changes to her past medical history, surgical history, family history, or social history.    Review of systems otherwise negative other than that mentioned in the HPI.    Objective:   Blood pressure 110/76, pulse 89, temperature 98.2 F (36.8 C), resp. rate 18, last menstrual period 11/14/2013, SpO2 98%. There is no height or weight on file to calculate BMI.    Physical Exam Vitals reviewed.  Constitutional:      Appearance: She is well-developed.     Comments: Anxious.   HENT:     Head: Normocephalic and atraumatic.     Right Ear: Tympanic membrane, ear canal and external ear normal. No drainage, swelling or tenderness. Tympanic membrane is not injected, scarred, erythematous, retracted or bulging.     Left Ear: Tympanic membrane, ear canal and external ear normal. No drainage, swelling or tenderness. Tympanic membrane is not injected,  scarred, erythematous, retracted or bulging.     Nose: No nasal deformity, septal deviation, mucosal edema or rhinorrhea.     Right Turbinates: Enlarged, swollen and pale.     Left Turbinates: Enlarged, swollen and pale.     Right Sinus: No maxillary sinus tenderness or frontal sinus tenderness.     Left Sinus: No maxillary sinus tenderness or frontal sinus tenderness.     Mouth/Throat:     Lips: Pink.     Mouth: Mucous membranes are moist. Mucous membranes are not pale and not dry.     Pharynx: Uvula midline.     Comments: No nasal polyps noted.  Eyes:     General:        Right eye: No discharge.        Left eye: No discharge.     Conjunctiva/sclera: Conjunctivae normal.      Right eye: Right conjunctiva is not injected. No chemosis.    Left eye: Left conjunctiva is not injected. No chemosis.    Pupils: Pupils are equal, round, and reactive to light.  Cardiovascular:     Rate and Rhythm: Normal rate and regular rhythm.     Heart sounds: Normal heart sounds.  Pulmonary:     Effort: Pulmonary effort is normal. No tachypnea, accessory muscle usage or respiratory distress.     Breath sounds: Normal breath sounds. No wheezing, rhonchi or rales.  Chest:     Chest wall: No tenderness.  Abdominal:     Tenderness: There is no abdominal tenderness. There is no guarding or rebound.  Lymphadenopathy:     Head:     Right side of head: No submandibular, tonsillar or occipital adenopathy.     Left side of head: No submandibular, tonsillar or occipital adenopathy.     Cervical: No cervical adenopathy.  Skin:    General: Skin is warm.     Capillary Refill: Capillary refill takes less than 2 seconds.     Coloration: Skin is not pale.     Findings: Erythema and rash present. No abrasion or petechiae. Rash is not papular, urticarial or vesicular.     Comments: There is confluent erythema on the face as well as the neck, appearing almost as a sun burn. This seems around the same or even worse than when I saw her in February. She does have some papules on the rest of her body with excoriations present as well. She shows me some pictures of the other lesions over her body on various places, including her arms and her legs.   Neurological:     General: No focal deficit present.     Mental Status: She is alert.  Psychiatric:        Behavior: Behavior is cooperative.      Diagnostic studies: labs sent instead     Drexel Gentles, MD  [facility] 

## 2024-03-18 NOTE — Patient Instructions (Addendum)
 1. Chronic urticaria - Previous work up was normal aside from from the anti thyroid  antibodies.  - Dupixent  loading dose provided today.  - Tammy will call you about the approval process. .  - In the meantime, continue suppressive dosing of antihistamines:   - Morning: Zyrtec  (cetirizine ) 10mg  (one tablet)  - Evening: Zyrtec  (cetirizine ) 10mg  (one tablet) - We are going to schedule you for patch testing to look for sensitizations to chemicals that might be causing your symptoms.  - These are placed on a Monday and then read on a Wednesday and a Friday.  - Call us  about the pads that you want refilled.  - Your hives are not run of the mill, so I am going to order an immune screen to make sure that we are not missing anything.   2. Seasonal and perennial allergic rhinitis - Testing in the past has showed: grasses, weeds, trees, outdoor molds, and cat - Copy of test results provided.  - Avoidance measures provided.  - You can use an extra dose of the antihistamine, if needed, for breakthrough symptoms.  - Consider nasal saline rinses 1-2 times daily to remove allergens from the nasal cavities as well as help with mucous clearance (this is especially helpful to do before the nasal sprays are given).  3. Return in about 4 weeks (around 04/15/2024). You can have the follow up appointment with Dr. Idolina Maker or a Nurse Practicioner (our Nurse Practitioners are excellent and always have Physician oversight!).    Please inform us  of any Emergency Department visits, hospitalizations, or changes in symptoms. Call us  before going to the ED for breathing or allergy  symptoms since we might be able to fit you in for a sick visit. Feel free to contact us  anytime with any questions, problems, or concerns.  It was a pleasure to see you again today!  Websites that have reliable patient information: 1. American Academy of Asthma, Allergy , and Immunology: www.aaaai.org 2. Food Allergy  Research and Education  (FARE): foodallergy.org 3. Mothers of Asthmatics: http://www.asthmacommunitynetwork.org 4. [employer] of Allergy , Asthma, and Immunology: www.acaai.org      "Like" us  on Facebook and Instagram for our latest updates!      A healthy democracy works best when [employer] participate! Make sure you are registered to vote! If you have moved or changed any of your contact information, you will need to get this updated before voting! Scan the QR codes below to learn more!

## 2024-03-25 ENCOUNTER — Telehealth: Payer: Self-pay | Admitting: *Deleted

## 2024-03-25 ENCOUNTER — Other Ambulatory Visit (HOSPITAL_COMMUNITY): Payer: Self-pay

## 2024-03-25 MED ORDER — DUPIXENT 300 MG/2ML ~~LOC~~ SOSY
300.0000 mg | PREFILLED_SYRINGE | SUBCUTANEOUS | 11 refills | Status: AC
  Filled 2024-03-26: qty 4, 28d supply, fill #0
  Filled 2024-04-16: qty 4, 28d supply, fill #1
  Filled 2024-05-18 – 2024-05-19 (×2): qty 4, 28d supply, fill #2
  Filled 2024-06-13 – 2024-06-15 (×2): qty 4, 28d supply, fill #3
  Filled 2024-07-09 – 2024-07-13 (×2): qty 4, 28d supply, fill #4
  Filled 2024-08-06: qty 4, 28d supply, fill #5
  Filled 2024-08-31 – 2024-09-10 (×2): qty 4, 28d supply, fill #6
  Filled 2024-10-01 – 2024-10-05 (×2): qty 4, 28d supply, fill #7
  Filled 2024-11-02: qty 4, 28d supply, fill #8
  Filled 2024-12-01: qty 4, 28d supply, fill #9
  Filled 2024-12-30: qty 4, 28d supply, fill #10

## 2024-03-25 NOTE — Telephone Encounter (Signed)
 L/m for patient to contact met to advise approval, copay card and submit to [facility] for Dupixent 

## 2024-03-25 NOTE — Telephone Encounter (Signed)
-----   Message from [place] Chuck sent at 03/18/2024  1:21 PM EDT ----- Started Dupixent  today. Gave loading dose. Since it is approved for hives now!

## 2024-03-26 ENCOUNTER — Other Ambulatory Visit: Payer: Self-pay

## 2024-03-26 ENCOUNTER — Other Ambulatory Visit (HOSPITAL_COMMUNITY): Payer: Self-pay

## 2024-03-26 NOTE — Progress Notes (Signed)
 Specialty Pharmacy Initial Fill Coordination Note  Meredith Burns is a 38 y.o. female contacted today regarding initial fill of specialty medication(s) Dupilumab  (Dupixent )   Patient requested Delivery   Delivery date: 03/31/24   Verified address: [address] [place] [address]   Medication will be filled on 03/30/2024.   Patient is aware of $0.00 copayment with copay card.

## 2024-03-26 NOTE — Progress Notes (Signed)
 Specialty Pharmacy Initiation Note   Meredith Burns is a 38 y.o. female who will be followed by the specialty pharmacy service for RxSp Allergy     Review of administration, indication, effectiveness, safety, potential side effects, storage/disposable, and missed dose instructions occurred today for patient's specialty medication(s) Dupilumab  (Dupixent )     Patient/Caregiver did not have any additional questions or concerns.   Patient's therapy is appropriate to: Initiate    Goals Addressed             This Visit's Progress    Reduce signs and symptoms       Patient is initiating therapy. Patient will maintain adherence and adhere to provider and/or lab appointments. Xolair  was ineffective after 4 doses per patient so provider elected to switch to Dupixent .          Rena Carnes Specialty Pharmacist

## 2024-03-30 ENCOUNTER — Other Ambulatory Visit: Payer: Self-pay

## 2024-03-31 ENCOUNTER — Other Ambulatory Visit (HOSPITAL_COMMUNITY): Payer: Self-pay

## 2024-03-31 ENCOUNTER — Other Ambulatory Visit: Payer: Self-pay

## 2024-03-31 NOTE — Progress Notes (Signed)
 Meredith Burns reached out to the specialty call center that patient has not yet received her delivery. As of 03/31/24 at 4:55pm, [employer] states driver has 3 more stops ahead of patient. Patient was left a voice mail about this latest delivery status and was informed to call the pharmacy for any further questions or concerns.

## 2024-04-13 ENCOUNTER — Other Ambulatory Visit: Payer: Self-pay

## 2024-04-16 ENCOUNTER — Other Ambulatory Visit: Payer: Self-pay

## 2024-04-16 ENCOUNTER — Other Ambulatory Visit (HOSPITAL_COMMUNITY): Payer: Self-pay

## 2024-04-16 ENCOUNTER — Other Ambulatory Visit (HOSPITAL_COMMUNITY): Payer: Self-pay | Admitting: Pharmacy Technician

## 2024-04-16 NOTE — Progress Notes (Signed)
 Specialty Pharmacy Refill Coordination Note  Meredith Burns is a 38 y.o. female contacted today regarding refills of specialty medication(s) Dupilumab  (Dupixent )   Patient requested Delivery   Delivery date: 04/28/24   Verified address: [address] [place], [place] [address]   Medication will be filled on 04/27/24.

## 2024-04-22 ENCOUNTER — Encounter: Payer: Self-pay | Admitting: Allergy & Immunology

## 2024-04-22 ENCOUNTER — Other Ambulatory Visit: Payer: Self-pay

## 2024-04-22 ENCOUNTER — Ambulatory Visit (INDEPENDENT_AMBULATORY_CARE_PROVIDER_SITE_OTHER): Admitting: Allergy & Immunology

## 2024-04-22 VITALS — BP 138/78 | HR 90 | Temp 97.9°F | Resp 18 | Ht 64.17 in | Wt 136.4 lb

## 2024-04-22 DIAGNOSIS — J3089 Other allergic rhinitis: Secondary | ICD-10-CM

## 2024-04-22 DIAGNOSIS — R197 Diarrhea, unspecified: Secondary | ICD-10-CM | POA: Diagnosis not present

## 2024-04-22 DIAGNOSIS — R220 Localized swelling, mass and lump, head: Secondary | ICD-10-CM

## 2024-04-22 DIAGNOSIS — J302 Other seasonal allergic rhinitis: Secondary | ICD-10-CM

## 2024-04-22 DIAGNOSIS — L501 Idiopathic urticaria: Secondary | ICD-10-CM | POA: Diagnosis not present

## 2024-04-22 DIAGNOSIS — R221 Localized swelling, mass and lump, neck: Secondary | ICD-10-CM

## 2024-04-22 MED ORDER — DOXYCYCLINE HYCLATE 100 MG PO TABS: 100.0000 mg | ORAL_TABLET | Freq: Two times a day (BID) | ORAL | 0 refills | Status: AC

## 2024-04-22 NOTE — Patient Instructions (Addendum)
 1. Chronic urticaria - Previous work up was normal aside from from the anti thyroid  antibodies.  - Continue with Dupixent .  - In the meantime, continue suppressive dosing of antihistamines:   - Morning: Zyrtec  (cetirizine ) 10mg  (one tablet)  - Evening: Zyrtec  (cetirizine ) 10mg  (one tablet) - We are going to schedule you for patch testing to look for sensitizations to chemicals that might be causing your symptoms (NAC-80)  - These are placed on a Monday and then read on a Wednesday and a Friday.  - Call us  about the pads that you want refilled.  - I am ordering a stool ova and parasite stool study. - I am also adding some Strongyloides antibodies.   2. Seasonal and perennial allergic rhinitis - Testing in the past has showed: grasses, weeds, trees, outdoor molds, and cat - You can use an extra dose of the antihistamine, if needed, for breakthrough symptoms.  - Consider nasal saline rinses 1-2 times daily to remove allergens from the nasal cavities as well as help with mucous clearance (this is especially helpful to do before the nasal sprays are given). - Start doxycycline  100mg  twice daily to help with a sinus infection. - We are getting an immune workup as well.   3. Return in about 3 months (around 07/23/2024). You can have the follow up appointment with Dr. Idolina Maker or a Nurse Practicioner (our Nurse Practitioners are excellent and always have Physician oversight!).    Please inform us  of any Emergency Department visits, hospitalizations, or changes in symptoms. Call us  before going to the ED for breathing or allergy  symptoms since we might be able to fit you in for a sick visit. Feel free to contact us  anytime with any questions, problems, or concerns.  It was a pleasure to see you again today!  Websites that have reliable patient information: 1. American Academy of Asthma, Allergy , and Immunology: www.aaaai.org 2. Food Allergy  Research and Education (FARE): foodallergy.org 3.  Mothers of Asthmatics: http://www.asthmacommunitynetwork.org 4. American College of Allergy , Asthma, and Immunology: www.acaai.org      "Like" us  on Facebook and Instagram for our latest updates!      A healthy democracy works best when [employer] participate! Make sure you are registered to vote! If you have moved or changed any of your contact information, you will need to get this updated before voting! Scan the QR codes below to learn more!

## 2024-04-22 NOTE — Progress Notes (Signed)
 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 Chronic urticaria - Previous work up was normal aside from from the anti thyroid  antibodies.  - Continue with Dupixent .  - In the meantime, continue suppressive dosing of antihistamines:   - Morning: Zyrtec  (cetirizine ) 10mg  (one tablet)  - Evening: Zyrtec  (cetirizine ) 10mg  (one tablet) - We are going to schedule you for patch testing to look for sensitizations to chemicals that might be causing your symptoms (NAC-80)  - These are placed on a Monday and then read on a Wednesday and a Friday.  - Call us  about the pads that you want refilled.  - I am ordering a stool ova and parasite stool study. - I am also adding some Strongyloides antibodies.   2. Seasonal and perennial allergic rhinitis - Testing in the past has showed: grasses, weeds, trees, outdoor molds, and cat - You can use an extra dose of the antihistamine, if needed, for breakthrough symptoms.  - Consider nasal saline rinses 1-2 times daily to remove allergens from the nasal cavities as well as help with mucous clearance (this is especially helpful to do before the nasal sprays are given). - Start doxycycline 100mg  twice daily to help with a sinus infection. - We are getting an immune workup as well.   3. Return in about 3 months (around  07/23/2024). You can have the follow up appointment with Dr. Idolina Maker or a Nurse Practicioner (our Nurse Practitioners are excellent and always have Physician oversight!).   Subjective:   Meredith Burns is a 38 y.o. female presenting today for follow up of  Chief Complaint  Patient presents with   Establish Care   Nasal Congestion    Meredith Burns has a history of the following: Patient Active Problem List   Diagnosis Date Noted   Patellofemoral pain syndrome 03/04/2024   Positive ANA (antinuclear antibody) 03/04/2024   Rash and other nonspecific skin eruption 03/04/2024   Vitamin D  deficiency 03/04/2024   Encounter for screening colonoscopy 02/04/2020   Attention deficit hyperactivity disorder (ADHD), combined type 10/28/2018   Family history of leukemia 10/28/2018   GAD (generalized anxiety disorder) 10/28/2018   Moderate episode of recurrent major depressive disorder (HCC) 10/28/2018   Radial scar of breast 04/15/2018   Breast cyst, left 12/25/2017   Monoallelic mutation of ATM gene 47/82/9562   S/P laparoscopic hysterectomy 10/04/2014   Constipation 10/01/2013   Nausea with vomiting 10/01/2013   Abdominal pain 10/01/2013   BACK PAIN 09/22/2007   HEADACHE, CHRONIC 09/22/2007   ATTENTION DEFICIT DISORDER, HX OF 09/22/2007   ADRENAL INSUFFICIENCY, HX OF 09/22/2007   SYMPTOM, PAIN, ABDOMINAL, GENERALIZED 08/18/2007   Generalized abdominal pain 08/18/2007   ACNE, MILD 02/03/2007   WEIGHT GAIN 02/03/2007   Acne, mild 02/03/2007   HEMATURIA 12/24/2006   SYNCOPE 12/24/2006   VOMITING 12/24/2006   FLANK PAIN, LEFT 12/24/2006  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  Discussed the use of AI scribe software for clinical note transcription with the patient and/or guardian, who gave verbal consent to proceed.  Meredith Burns is a 38 y.o. female presenting for a follow up visit. She was last seen  in April 2025. At that time, we decided to start Dupixent .  We continue with Zyrtec  twice daily.  Will start by doing patch testing.  For her allergic rhinitis, is doing well with antihistamines.  She has been positive to grasses, weeds, trees, abnormals, and cat in the past.  Since last visit, she has done a bit better.   She has experienced improvement in her symptoms since starting Dupixent , with better results compared to Xolair . She has received three injections, including a loading dose, and notes an improved quality of life due to reduced itching, although rashes persist. The Dupixent  is really doing better than the Xolair , although she continues to have the erythema over her cheeks, neck, and upper chest.   She recently completed a course of Bactrim for a sinus infection but continues to have bright green nasal discharge. She has previously tried doxycycline, which was ineffective for her sinus issues. She has sinus infections frequently. She is not currently taking Zyrtec  twice a day. She also mentions using facial pads, which she uses regularly.   We did order an immune workup at the last visit, but she never got them done.   She is concerned about potential gastrointestinal issues and wonders if they could be related to a parasite. She denies drinking creek water, but someone told her that her itching and GI symptoms could be related to this. She reports ongoing skin issues, including rashes, despite the absence of itching.    She has a history of breast cancer and an ATM mutation identified during her cancer workup. 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  No frequent infections such as pneumonia or sinus infections. She tends to be the "giver" [of infections] in her household, but does not necessarily get sick herself.  Otherwise, there have been no changes to her past medical history,  surgical history, family history, or social history.    Review of systems otherwise negative other than that mentioned in the HPI.    Objective:   Blood pressure 138/78, pulse 90, temperature 97.9 F (36.6 C), temperature source Temporal, resp. rate 18, height 5' 4.17" (1.63 m), weight 136 lb 6.4 oz (61.9 kg), last menstrual period 11/14/2013, SpO2 98%. Body mass index is 23.29 kg/m.    Physical Exam Vitals reviewed.  Constitutional:      Appearance: She is well-developed.     Comments: Anxious.   HENT:     Head: Normocephalic and atraumatic.     Right Ear: Tympanic membrane, ear canal and external ear normal. No drainage, swelling or tenderness. Tympanic membrane is not injected, scarred, erythematous, retracted or bulging.     Left Ear: Tympanic membrane, ear canal and external ear normal. No drainage, swelling or tenderness. Tympanic membrane is not injected, scarred, erythematous, retracted or bulging.     Nose: No nasal deformity, septal deviation, mucosal edema or rhinorrhea.     Right Turbinates: Enlarged, swollen and pale.     Left Turbinates: Enlarged, swollen and pale.     Right Sinus: No maxillary sinus tenderness or frontal sinus tenderness.  Left Sinus: No maxillary sinus tenderness or frontal sinus tenderness.     Mouth/Throat:     Lips: Pink.     Mouth: Mucous membranes are moist. Mucous membranes are not pale and not dry.     Pharynx: Uvula midline.     Comments: No nasal polyps noted.  Eyes:     General: Lids are normal. Allergic shiner present.        Right eye: No discharge.        Left eye: No discharge.     Conjunctiva/sclera: Conjunctivae normal.     Right eye: Right conjunctiva is not injected. No chemosis.    Left eye: Left conjunctiva is not injected. No chemosis.    Pupils: Pupils are equal, round, and reactive to light.  Cardiovascular:     Rate and Rhythm: Normal rate and regular rhythm.     Heart sounds: Normal heart sounds.  Pulmonary:      Effort: Pulmonary effort is normal. No tachypnea, accessory muscle usage or respiratory distress.     Breath sounds: Normal breath sounds. No wheezing, rhonchi or rales.  Chest:     Chest wall: No tenderness.  Abdominal:     Tenderness: There is no abdominal tenderness. There is no guarding or rebound.  Lymphadenopathy:     Head:     Right side of head: No submandibular, tonsillar or occipital adenopathy.     Left side of head: No submandibular, tonsillar or occipital adenopathy.     Cervical: No cervical adenopathy.  Skin:    General: Skin is warm.     Capillary Refill: Capillary refill takes less than 2 seconds.     Coloration: Skin is not pale.     Findings: Erythema and rash present. No abrasion or petechiae. Rash is not papular, urticarial or vesicular.     Comments: There is confluent erythema on the face as well as the neck, appearing almost as a sun burn. This seems around the same or even worse than when I saw her in February. She does have some papules on the rest of her body with excoriations present as well. She shows me some pictures of the other lesions over her body on various places, including her arms and her legs.   Neurological:     General: No focal deficit present.     Mental Status: She is alert.  Psychiatric:        Behavior: Behavior is cooperative.      Diagnostic studies: none        Drexel Gentles, MD  [facility] 

## 2024-04-27 ENCOUNTER — Other Ambulatory Visit: Payer: Self-pay

## 2024-04-30 ENCOUNTER — Other Ambulatory Visit: Payer: Self-pay

## 2024-04-30 NOTE — Progress Notes (Signed)
 Clinical Intervention Note  Clinical Intervention Notes: Patient called stating that she forgot to take her Dupixent  yesterday and is now out of town until Sunday. She does not regularly miss or delay doses. Advised that she can take her injection upon her return to home Sunday and then can go back to her regular schedule for her following dose which will be due on 6/18. Counseled that she may experience some mild symptom return by the end of the weekend.   Clinical Intervention Outcomes: Improved therapy adherence   Rena Carnes Specialty Pharmacist

## 2024-05-18 ENCOUNTER — Other Ambulatory Visit: Payer: Self-pay

## 2024-05-19 ENCOUNTER — Other Ambulatory Visit: Payer: Self-pay

## 2024-05-19 NOTE — Progress Notes (Signed)
 Specialty Pharmacy Refill Coordination Note  Meredith Burns is a 38 y.o. female contacted today regarding refills of specialty medication(s) Dupilumab  (Dupixent )   Patient requested Delivery   Delivery date: 05/20/24   Verified address: (Patient-Rptd) [address] [place] [address]   Medication will be filled on 06.24.25.

## 2024-06-14 NOTE — Progress Notes (Unsigned)
 Follow-up Note  RE: Meredith Burns MRN: [id number] DOB: 1985/12/14 Date of Office Visit: 06/15/2024  Primary care provider: Teresa Aldona CROME, NP Referring provider: Teresa Aldona CROME, NP   Meredith Burns returns to the office today for the patch test placement, given suspected history of contact dermatitis. Care of patches thoroughly discussed, specifically that the patches must remain dry. All questions answered at today's visit.    Diagnostics: NAC 80 patches placed NAC-80 (1-80)   1. Ammonium persulfate  2. [place] Balsam  3. Omitted  4. 4-tert-Butylphenolformaldehyde resin (PTBP)  5. Bacitracin  6. Budesonide  7. Quaternium-15  8. Cinnamal  9. Cobalt(II) chloride hexahydrate  10. Colophonium  11. Methyldibromo glutaronitrile  12. Decyl Glucoside  13. Ethylenediamine dihydrochloride  14. 2-Hydroxyethyl methacrylate  15. Hydroperoxides of Linalool  16. Iodopropynyl butylcarbamate  17. 2-Mercaptobenzothiazole (MBT)  18. Thiuram mix  19. METHYLISOTHIAZOLINONE  20. Propylene glycol  21. 1,3-Diphenylguanidine  22. Hydroperoxides of Limonene  23. Black rubber mix  24. Carba mix  25. Fragrance mix I  26. Fragrance mix II  27. Textile dye mix II  28. Neomycin sulfate  29. Nickel(II) sulfate hexahydrate  30. p-Phenylenediamine (PPD)  31. Potassium dichromate  32. Propolis  33. Sodium Metabisulfite  34. Tixocortol-21-pivalate  35. Lanolin alcohol  36. Methylisothiazolinone + Methylchloroisothiazolinone  37. Cocamidopropyl betaine  38. 3-(Dimethylamino)-1-propylamine  39. Formaldehyde  40. Oleamidopropyl dimethylamine  41. 2-Bromo-2-Nitropropane-l,3-diol  42. Diazolidinyl urea  43. DMDM Hydantoin  44. Epoxy resin, Bisphenol A  45. Benzophenone-4  46. Imidazolidinyl urea  47. Lauryl polyglucose  48 Methyl methacrylate  49. Paraben mix  50. Mercapto mix  51. Caine mix III  52. Mixed dialkyl thiourea  53. Compositae mix II  54. Toluenesulfonamide formaldehyde resin   55. Tea Tree Oil oxidized  56. Ylang-Ylang oil  57. Amidoamine  58. Amerchol L 101  59. Benzocaine  60. Benzyl alchohol  61. Benzyl salicylate  62. Chloroxylenol (PCMX)  63. Cocamide DEA  64. Clobetasol-17-propionate  65. Toluene-2,5-Diamine sulfate  66. Ethyl acrylate  67. N-Isopropyl-N-phenyl--4-phenylenediamine (IPPD)  68. Lidocaine  69. omitted  70. Sesquiterpene lactone mix  71. 2-n-Octyl-4-isothiazolin-3-one  72. Propyl gallate  73. Polymyxin B sulfate  74. Pramoxine hydrochloride  75. Sodium benzoate  76. Sorbitan oleate  77. Sorbitan sesquioleate  78. Tocopherol  79. BENZALKONIUM CHLORIDE  80. Chlorhexidine digluconate    Allergic contact dermatitis - Instructions provided on care of the patches for the next 48 hours. Meredith Burns was instructed to avoid showering for the next 48 hours. Meredith Burns will follow up in 48 hours and 96 hours for patch readings.    Call the clinic if this treatment plan is not working well for you  Follow up in 2 days or sooner if needed.  Thank you for the opportunity to care for this patient.  Please do not hesitate to contact me with questions.  Arlean Mutter, FNP [facility]  [facility]

## 2024-06-15 ENCOUNTER — Other Ambulatory Visit: Payer: Self-pay

## 2024-06-15 ENCOUNTER — Ambulatory Visit: Admitting: Family Medicine

## 2024-06-15 ENCOUNTER — Encounter: Admitting: Family Medicine

## 2024-06-15 ENCOUNTER — Encounter: Payer: Self-pay | Admitting: Family Medicine

## 2024-06-15 ENCOUNTER — Encounter (INDEPENDENT_AMBULATORY_CARE_PROVIDER_SITE_OTHER): Payer: Self-pay

## 2024-06-15 DIAGNOSIS — L235 Allergic contact dermatitis due to other chemical products: Secondary | ICD-10-CM | POA: Diagnosis not present

## 2024-06-15 NOTE — Progress Notes (Signed)
 Specialty Pharmacy Refill Coordination Note  Meredith Burns is a 38 y.o. female contacted today regarding refills of specialty medication(s) Dupilumab  (Dupixent )   Patient requested (Patient-Rptd) Delivery   Delivery date: 06/17/24   Verified address: (Patient-Rptd) [address] [place] [address]   Medication will be filled on 07.22.25.

## 2024-06-15 NOTE — Patient Instructions (Signed)
 Diagnostics: NAC 80 patches placed NAC-80 (1-80)   1. Ammonium persulfate  2. [place] Balsam  3. Omitted  4. 4-tert-Butylphenolformaldehyde resin (PTBP)  5. Bacitracin  6. Budesonide  7. Quaternium-15  8. Cinnamal  9. Cobalt(II) chloride hexahydrate  10. Colophonium  11. Methyldibromo glutaronitrile  12. Decyl Glucoside  13. Ethylenediamine dihydrochloride  14. 2-Hydroxyethyl methacrylate  15. Hydroperoxides of Linalool  16. Iodopropynyl butylcarbamate  17. 2-Mercaptobenzothiazole (MBT)  18. Thiuram mix  19. METHYLISOTHIAZOLINONE  20. Propylene glycol  21. 1,3-Diphenylguanidine  22. Hydroperoxides of Limonene  23. Black rubber mix  24. Carba mix  25. Fragrance mix I  26. Fragrance mix II  27. Textile dye mix II  28. Neomycin sulfate  29. Nickel(II) sulfate hexahydrate  30. p-Phenylenediamine (PPD)  31. Potassium dichromate  32. Propolis  33. Sodium Metabisulfite  34. Tixocortol-21-pivalate  35. Lanolin alcohol  36. Methylisothiazolinone + Methylchloroisothiazolinone  37. Cocamidopropyl betaine  38. 3-(Dimethylamino)-1-propylamine  39. Formaldehyde  40. Oleamidopropyl dimethylamine  41. 2-Bromo-2-Nitropropane-l,3-diol  42. Diazolidinyl urea  43. DMDM Hydantoin  44. Epoxy resin, Bisphenol A  45. Benzophenone-4  46. Imidazolidinyl urea  47. Lauryl polyglucose  48 Methyl methacrylate  49. Paraben mix  50. Mercapto mix  51. Caine mix III  52. Mixed dialkyl thiourea  53. Compositae mix II  54. Toluenesulfonamide formaldehyde resin  55. Tea Tree Oil oxidized  56. Ylang-Ylang oil  57. Amidoamine  58. Amerchol L 101  59. Benzocaine  60. Benzyl alchohol  61. Benzyl salicylate  62. Chloroxylenol (PCMX)  63. Cocamide DEA  64. Clobetasol-17-propionate  65. Toluene-2,5-Diamine sulfate  66. Ethyl acrylate  67. N-Isopropyl-N-phenyl--4-phenylenediamine (IPPD)  68. Lidocaine  69. omitted  70. Sesquiterpene lactone mix  71. 2-n-Octyl-4-isothiazolin-3-one  72. Propyl  gallate  73. Polymyxin B sulfate  74. Pramoxine hydrochloride  75. Sodium benzoate  76. Sorbitan oleate  77. Sorbitan sesquioleate  78. Tocopherol  79. BENZALKONIUM CHLORIDE  80. Chlorhexidine digluconate    Allergic contact dermatitis - Instructions provided on care of the patches for the next 48 hours. GLENWOOD Krabbe was instructed to avoid showering for the next 48 hours. Elanora Quin will follow up in 48 hours and 96 hours for patch readings.    Call the clinic if this treatment plan is not working well for you  Follow up in 2 days or sooner if needed.

## 2024-06-16 ENCOUNTER — Other Ambulatory Visit: Payer: Self-pay

## 2024-06-16 NOTE — Progress Notes (Unsigned)
 Follow-up Note  RE: Meredith Burns MRN: [id number] DOB: 09/19/1986 Date of Office Visit: 06/17/2024  Primary care provider: Teresa Aldona CROME, NP Referring provider: Teresa Aldona CROME, NP   [name] returns to the office today for the initial patch test interpretation, given suspected history of contact dermatitis.    Diagnostics:    NAC 80 48-hour reading:  NAC Panel Tested NAC-80 (1-80)   1. Ammonium persulfate Negative   2. [place] Balsam Negative   3. BENZISOTHIAZOLINONE Comment   omit  4. 4-tert-Butylphenolformaldehyde resin (PTBP) Negative   5. Bacitracin Negative   6. Budesonide Negative   7. Quaternium-15 Negative   8. Cinnamal Negative   9. Cobalt(II) chloride hexahydrate Negative  10. Colophonium Negative   11. Methyldibromo glutaronitrile Negative   12. Decyl Glucoside Negative   13. Ethylenediamine dihydrochloride Negative   14. 2-Hydroxyethyl methacrylate Negative   15. Hydroperoxides of Linalool Negative   16. Iodopropynyl butylcarbamate Negative   17. 2-Mercaptobenzothiazole (MBT) Negative   18. Thiuram mix Negative   19. METHYLISOTHIAZOLINONE Negative   20. Propylene glycol Negative   21. 1,3-Diphenylguanidine Negative   22. Hydroperoxides of Limonene Negative   23. Black rubber mix Negative   24. Carba mix Negative   25. Fragrance mix I Negative   26. Fragrance mix II Negative   27. Textile dye mix II Negative   28. Neomycin sulfate Negative   29. Nickel(II) sulfate hexahydrate Negative  30. p-Phenylenediamine (PPD) Negative   31. Potassium dichromate Negative   32. Propolis Negative   33. Sodium Metabisulfite Negative   34. Tixocortol-21-pivalate Negative   35. Lanolin alcohol Negative   36. Methylisothiazolinone + Methylchloroisothiazolinone Negative   37. Cocamidopropyl betaine Negative   38. 3-(Dimethylamino)-1-propylamine Negative   39. Formaldehyde Negative  40. Oleamidopropyl dimethylamine Negative   41. 2-Bromo-2-Nitropropane-l,3-diol  Negative   42. Diazolidinyl urea Negative  43. DMDM Hydantoin Negative   44. Epoxy resin, Bisphenol A Negative   45. Benzophenone-4 Negative   46. Imidazolidinyl urea Negative   47. Lauryl polyglucose Negative  48 Methyl methacrylate Negative   49. Paraben mix Negative   50. Mercapto mix Negative   51. Caine mix III Negative   52. Mixed dialkyl thiourea Negative   53. Compositae mix II Negative   54. Toluenesulfonamide formaldehyde resin Negative   55. Tea Tree Oil oxidized Negative   56. Ylang-Ylang oil Negative   57. Amidoamine Negative   58. Amerchol L 101 Negative   59. Benzocaine Negative   60. Benzyl alchohol Negative   61. Benzyl salicylate Negative   62. Chloroxylenol (PCMX) Negative   63. Cocamide DEA Negative   64. Clobetasol-17-propionate Negative   65. Toluene-2,5-Diamine sulfate Negative   66. Ethyl acrylate Negative   67. N-Isopropyl-N-phenyl--4-phenylenediamine (IPPD) Negative   68. Lidocaine Negative   69. Hydroxyisohexyl 3-Cyclohexene Carboxaldehyde Comment   omit  70. Sesquiterpene lactone mix Negative   71. 2-n-Octyl-4-isothiazolin-3-one Negative   72. Propyl gallate Negative   73. Polymyxin B sulfate Negative   74. Pramoxine hydrochloride Negative   75. Sodium benzoate Negative   76. Sorbitan oleate Negative   77. Sorbitan sesquioleate Negative   78. Tocopherol Negative   79. BENZALKONIUM CHLORIDE Negative   80. Chlorhexidine digluconate Negative     Plan:   Allergic contact dermatitis - The patient has been provided detailed information regarding the substances *** is sensitive to, as well as products containing the substances.   - Meticulous avoidance of these substances is recommended.  - If avoidance is not possible,  the use of barrier creams or lotions is recommended. - If symptoms persist or progress despite meticulous avoidance of ***, a dermatology referral may be warranted.  Thank you for the opportunity to care for this patient.  Please  do not hesitate to contact me with questions.  Arlean Mutter, FNP [facility]  [facility]

## 2024-06-17 ENCOUNTER — Encounter: Payer: Self-pay | Admitting: Family Medicine

## 2024-06-17 ENCOUNTER — Ambulatory Visit (INDEPENDENT_AMBULATORY_CARE_PROVIDER_SITE_OTHER): Admitting: Family Medicine

## 2024-06-17 DIAGNOSIS — L235 Allergic contact dermatitis due to other chemical products: Secondary | ICD-10-CM

## 2024-06-17 NOTE — Patient Instructions (Signed)
 Diagnostics:    NAC 80 48-hour reading:  NAC Panel Tested NAC-80 (1-80)   1. Ammonium persulfate Negative   2. [place] Balsam Negative   3. BENZISOTHIAZOLINONE Comment   omit  4. 4-tert-Butylphenolformaldehyde resin (PTBP) Negative   5. Bacitracin Negative   6. Budesonide Negative   7. Quaternium-15 Negative   8. Cinnamal Negative   9. Cobalt(II) chloride hexahydrate Negative  10. Colophonium Negative   11. Methyldibromo glutaronitrile Negative   12. Decyl Glucoside Negative   13. Ethylenediamine dihydrochloride Negative   14. 2-Hydroxyethyl methacrylate Negative   15. Hydroperoxides of Linalool Negative   16. Iodopropynyl butylcarbamate Negative   17. 2-Mercaptobenzothiazole (MBT) Negative   18. Thiuram mix Negative   19. METHYLISOTHIAZOLINONE Negative   20. Propylene glycol +/_  21. 1,3-Diphenylguanidine Negative   22. Hydroperoxides of Limonene Negative   23. Black rubber mix Negative   24. Carba mix Negative   25. Fragrance mix I Negative   26. Fragrance mix II Negative   27. Textile dye mix II 2   28. Neomycin sulfate Negative   29. Nickel(II) sulfate hexahydrate Negative  30. p-Phenylenediamine (PPD) Negative   31. Potassium dichromate Negative   32. Propolis +/-  33. Sodium Metabisulfite Negative   34. Tixocortol-21-pivalate Negative   35. Lanolin alcohol Negative   36. Methylisothiazolinone + Methylchloroisothiazolinone Negative   37. Cocamidopropyl betaine Negative   38. 3-(Dimethylamino)-1-propylamine Negative   39. Formaldehyde Negative  40. Oleamidopropyl dimethylamine Negative   41. 2-Bromo-2-Nitropropane-l,3-diol Negative   42. Diazolidinyl urea Negative  43. DMDM Hydantoin Negative   44. Epoxy resin, Bisphenol A Negative   45. Benzophenone-4 Negative   46. Imidazolidinyl urea Negative   47. Lauryl polyglucose Negative  48 Methyl methacrylate Negative   49. Paraben mix Negative   50. Mercapto mix Negative   51. Caine mix III Negative   52. Mixed  dialkyl thiourea Negative   53. Compositae mix II Negative   54. Toluenesulfonamide formaldehyde resin Negative   55. Tea Tree Oil oxidized Negative   56. Ylang-Ylang oil Negative   57. Amidoamine Negative   58. Amerchol L 101 Negative   59. Benzocaine Negative   60. Benzyl alchohol Negative   61. Benzyl salicylate Negative   62. Chloroxylenol (PCMX) Negative   63. Cocamide DEA Negative   64. Clobetasol-17-propionate Negative   65. Toluene-2,5-Diamine sulfate Negative   66. Ethyl acrylate Negative   67. N-Isopropyl-N-phenyl--4-phenylenediamine (IPPD) Negative   68. Lidocaine Negative   69. Hydroxyisohexyl 3-Cyclohexene Carboxaldehyde Comment   omit  70. Sesquiterpene lactone mix Negative   71. 2-n-Octyl-4-isothiazolin-3-one Negative   72. Propyl gallate Negative   73. Polymyxin B sulfate Negative   74. Pramoxine hydrochloride Negative   75. Sodium benzoate Negative   76. Sorbitan oleate Negative   77. Sorbitan sesquioleate Negative   78. Tocopherol Negative   79. BENZALKONIUM CHLORIDE Negative   80. Chlorhexidine digluconate Negative     Plan:   Allergic contact dermatitis - The patient has been provided detailed information regarding the substances she is sensitive to, as well as products containing the substances.   - Meticulous avoidance of these substances is recommended.  - If avoidance is not possible, the use of barrier creams or lotions is recommended. - If symptoms persist or progress despite meticulous avoidance of the substances as listed below, a dermatology referral may be warranted.  Call the clinic if this treatment plan is not working well for you  Follow up in 2 days or sooner if needed.

## 2024-06-18 NOTE — Progress Notes (Unsigned)
 Follow-up Note  RE: Meredith Burns MRN: [id number] DOB: July 17, 1986 Date of Office Visit: 06/19/2024  Primary care provider: Teresa Aldona CROME, NP Referring provider: Teresa Aldona CROME, NP   [name] returns to the office today for the final patch test interpretation, given suspected history of contact dermatitis.    Diagnostics:    NAC 80 48-hour reading:  NAC Panel Tested NAC-80 (1-80)   1. Ammonium persulfate Negative   2. [place] Balsam Negative   3. BENZISOTHIAZOLINONE Comment   omit  4. 4-tert-Butylphenolformaldehyde resin (PTBP) Negative   5. Bacitracin Negative   6. Budesonide Negative   7. Quaternium-15 Negative   8. Cinnamal Negative   9. Cobalt(II) chloride hexahydrate Negative  10. Colophonium Negative   11. Methyldibromo glutaronitrile Negative   12. Decyl Glucoside Negative   13. Ethylenediamine dihydrochloride Negative   14. 2-Hydroxyethyl methacrylate Negative   15. Hydroperoxides of Linalool Negative   16. Iodopropynyl butylcarbamate Negative   17. 2-Mercaptobenzothiazole (MBT) Negative   18. Thiuram mix Negative   19. METHYLISOTHIAZOLINONE Negative   20. Propylene glycol Negative   21. 1,3-Diphenylguanidine Negative   22. Hydroperoxides of Limonene Negative   23. Black rubber mix Negative   24. Carba mix Negative   25. Fragrance mix I Negative   26. Fragrance mix II Negative   27. Textile dye mix II Negative   28. Neomycin sulfate Negative   29. Nickel(II) sulfate hexahydrate Negative  30. p-Phenylenediamine (PPD) Negative   31. Potassium dichromate Negative   32. Propolis Negative   33. Sodium Metabisulfite Negative   34. Tixocortol-21-pivalate Negative   35. Lanolin alcohol Negative   36. Methylisothiazolinone + Methylchloroisothiazolinone Negative   37. Cocamidopropyl betaine Negative   38. 3-(Dimethylamino)-1-propylamine Negative   39. Formaldehyde Negative  40. Oleamidopropyl dimethylamine Negative   41. 2-Bromo-2-Nitropropane-l,3-diol  Negative   42. Diazolidinyl urea Negative  43. DMDM Hydantoin Negative   44. Epoxy resin, Bisphenol A Negative   45. Benzophenone-4 Negative   46. Imidazolidinyl urea Negative   47. Lauryl polyglucose Negative  48 Methyl methacrylate Negative   49. Paraben mix Negative   50. Mercapto mix Negative   51. Caine mix III Negative   52. Mixed dialkyl thiourea Negative   53. Compositae mix II Negative   54. Toluenesulfonamide formaldehyde resin Negative   55. Tea Tree Oil oxidized Negative   56. Ylang-Ylang oil Negative   57. Amidoamine Negative   58. Amerchol L 101 Negative   59. Benzocaine Negative   60. Benzyl alchohol Negative   61. Benzyl salicylate Negative   62. Chloroxylenol (PCMX) Negative   63. Cocamide DEA Negative   64. Clobetasol-17-propionate Negative   65. Toluene-2,5-Diamine sulfate Negative   66. Ethyl acrylate Negative   67. N-Isopropyl-N-phenyl--4-phenylenediamine (IPPD) Negative   68. Lidocaine Negative   69. Hydroxyisohexyl 3-Cyclohexene Carboxaldehyde Comment   omit  70. Sesquiterpene lactone mix Negative   71. 2-n-Octyl-4-isothiazolin-3-one Negative   72. Propyl gallate Negative   73. Polymyxin B sulfate Negative   74. Pramoxine hydrochloride Negative   75. Sodium benzoate Negative   76. Sorbitan oleate Negative   77. Sorbitan sesquioleate Negative   78. Tocopherol Negative   79. BENZALKONIUM CHLORIDE Negative   80. Chlorhexidine digluconate Negative    Previous testing positive to propylene glycol, textile dye mix 2, and propolis   Plan:   Allergic contact dermatitis - The patient has been provided detailed information regarding the substances she is sensitive to, as well as products containing the substances.   - Meticulous  avoidance of these substances is recommended.  - If avoidance is not possible, the use of barrier creams or lotions is recommended. - If symptoms persist or progress despite meticulous avoidance of ***, a dermatology referral may  be warranted. - The sensitivity of patch testing can range from 60-80% and therefore false negative results are possible. Therefore, this does not definitively rule out contact dermatitis.   Thank you for the opportunity to care for this patient.  Please do not hesitate to contact me with questions.  Arlean Mutter, FNP [facility]  [facility]

## 2024-06-19 ENCOUNTER — Encounter: Payer: Self-pay | Admitting: Family Medicine

## 2024-06-19 ENCOUNTER — Ambulatory Visit: Admitting: Family Medicine

## 2024-06-19 DIAGNOSIS — L235 Allergic contact dermatitis due to other chemical products: Secondary | ICD-10-CM | POA: Diagnosis not present

## 2024-06-19 NOTE — Patient Instructions (Addendum)
 Diagnostics:    NAC 80 48-hour reading:  NAC Panel Tested NAC-80 (1-80)   1. Ammonium persulfate Negative   2. [place] Balsam Negative   3. BENZISOTHIAZOLINONE Comment   omit  4. 4-tert-Butylphenolformaldehyde resin (PTBP) Negative   5. Bacitracin Negative   6. Budesonide Negative   7. Quaternium-15 Negative   8. Cinnamal Negative   9. Cobalt(II) chloride hexahydrate Negative  10. Colophonium Negative   11. Methyldibromo glutaronitrile Negative   12. Decyl Glucoside Negative   13. Ethylenediamine dihydrochloride Negative   14. 2-Hydroxyethyl methacrylate Negative   15. Hydroperoxides of Linalool Negative   16. Iodopropynyl butylcarbamate Negative   17. 2-Mercaptobenzothiazole (MBT) Negative   18. Thiuram mix Negative   19. METHYLISOTHIAZOLINONE Negative   20. Propylene glycol +/-  21. 1,3-Diphenylguanidine Negative   22. Hydroperoxides of Limonene Negative   23. Black rubber mix Negative   24. Carba mix Negative   25. Fragrance mix I Negative   26. Fragrance mix II Negative   27. Textile dye mix II +/-  28. Neomycin sulfate Negative   29. Nickel(II) sulfate hexahydrate Negative  30. p-Phenylenediamine (PPD) Negative   31. Potassium dichromate Negative   32. Propolis Negative   33. Sodium Metabisulfite Negative   34. Tixocortol-21-pivalate Negative   35. Lanolin alcohol Negative   36. Methylisothiazolinone + Methylchloroisothiazolinone Negative   37. Cocamidopropyl betaine Negative   38. 3-(Dimethylamino)-1-propylamine Negative   39. Formaldehyde Negative  40. Oleamidopropyl dimethylamine Negative   41. 2-Bromo-2-Nitropropane-l,3-diol Negative   42. Diazolidinyl urea Negative  43. DMDM Hydantoin Negative   44. Epoxy resin, Bisphenol A Negative   45. Benzophenone-4 Negative   46. Imidazolidinyl urea Negative   47. Lauryl polyglucose Negative  48 Methyl methacrylate Negative   49. Paraben mix Negative   50. Mercapto mix Negative   51. Caine mix III Negative   52.  Mixed dialkyl thiourea Negative   53. Compositae mix II Negative   54. Toluenesulfonamide formaldehyde resin Negative   55. Tea Tree Oil oxidized Negative   56. Ylang-Ylang oil Negative   57. Amidoamine Negative   58. Amerchol L 101 Negative   59. Benzocaine Negative   60. Benzyl alchohol Negative   61. Benzyl salicylate Negative   62. Chloroxylenol (PCMX) Negative   63. Cocamide DEA Negative   64. Clobetasol-17-propionate Negative   65. Toluene-2,5-Diamine sulfate Negative   66. Ethyl acrylate Negative   67. N-Isopropyl-N-phenyl--4-phenylenediamine (IPPD) Negative   68. Lidocaine Negative   69. Hydroxyisohexyl 3-Cyclohexene Carboxaldehyde Comment   omit  70. Sesquiterpene lactone mix Negative   71. 2-n-Octyl-4-isothiazolin-3-one Negative   72. Propyl gallate Negative   73. Polymyxin B sulfate Negative   74. Pramoxine hydrochloride Negative   75. Sodium benzoate Negative   76. Sorbitan oleate Negative   77. Sorbitan sesquioleate Negative   78. Tocopherol Negative   79. BENZALKONIUM CHLORIDE Negative   80. Chlorhexidine digluconate Negative    Previous testing positive to propylene glycol, textile dye mix 2, and propolis  Today's testing borderline positive to polyethylene glycol and textile dye mix 2  Plan:   Allergic contact dermatitis - The patient has been provided detailed information regarding the substances she is sensitive to, as well as products containing the substances.   - Meticulous avoidance of these substances is recommended.  - If avoidance is not possible, the use of barrier creams or lotions is recommended. - If symptoms persist or progress despite meticulous avoidance of the substances as listed above, a dermatology referral may be warranted. -  The sensitivity of patch testing can range from 60-80% and therefore false negative results are possible. Therefore, this does not definitively rule out contact dermatitis.  - A safe email will be sent to your email  address listed with our office.  Call the clinic if this treatment plan is not working well for you.  Follow up in 3 months or sooner if needed.

## 2024-06-19 NOTE — Addendum Note (Signed)
 Addended by: FRANCIS ROULEAU A on: 06/19/2024 12:03 PM   Modules accepted: Orders

## 2024-06-24 ENCOUNTER — Ambulatory Visit: Admitting: Allergy & Immunology

## 2024-07-06 ENCOUNTER — Encounter: Admitting: Family Medicine

## 2024-07-09 ENCOUNTER — Other Ambulatory Visit: Payer: Self-pay

## 2024-07-13 ENCOUNTER — Other Ambulatory Visit: Payer: Self-pay

## 2024-07-14 ENCOUNTER — Other Ambulatory Visit: Payer: Self-pay | Admitting: Pharmacy Technician

## 2024-07-14 ENCOUNTER — Other Ambulatory Visit: Payer: Self-pay

## 2024-07-14 NOTE — Progress Notes (Signed)
 Specialty Pharmacy Refill Coordination Note  Meredith Burns is a 38 y.o. female contacted today regarding refills of specialty medication(s) Dupilumab  (Dupixent )   Patient requested Delivery   Delivery date: 07/16/24   Verified address: [address]  [place]   Medication will be filled on 07/15/24.

## 2024-08-05 ENCOUNTER — Encounter (INDEPENDENT_AMBULATORY_CARE_PROVIDER_SITE_OTHER): Payer: Self-pay

## 2024-08-06 ENCOUNTER — Other Ambulatory Visit (HOSPITAL_COMMUNITY): Payer: Self-pay

## 2024-08-06 ENCOUNTER — Other Ambulatory Visit: Payer: Self-pay

## 2024-08-06 NOTE — Progress Notes (Signed)
 Specialty Pharmacy Refill Coordination Note  MyChart Questionnaire Submission  Meredith Burns is a 38 y.o. female contacted today regarding refills of specialty medication(s) Dupixent .  Doses on hand: (Patient-Rptd) 0   Injection date: (Patient-Rptd) 08/19/24  Patient requested: (Patient-Rptd) Delivery   Delivery date: 08/11/24  Verified address: [address] [place] [address]  Medication will be filled on 08/10/24.

## 2024-08-10 ENCOUNTER — Encounter: Payer: Self-pay | Admitting: Allergy & Immunology

## 2024-08-10 ENCOUNTER — Other Ambulatory Visit: Payer: Self-pay

## 2024-08-12 MED ORDER — TRIAMCINOLONE ACETONIDE 0.1 % EX CREA: TOPICAL_CREAM | Freq: Two times a day (BID) | CUTANEOUS | 0 refills | Status: AC | PRN

## 2024-09-01 ENCOUNTER — Other Ambulatory Visit: Payer: Self-pay

## 2024-09-10 ENCOUNTER — Other Ambulatory Visit: Payer: Self-pay

## 2024-09-10 ENCOUNTER — Encounter (INDEPENDENT_AMBULATORY_CARE_PROVIDER_SITE_OTHER): Payer: Self-pay

## 2024-09-10 NOTE — Progress Notes (Signed)
 Specialty Pharmacy Refill Coordination Note  Meredith Burns is a 38 y.o. female contacted today regarding refills of specialty medication(s) Dupilumab  (Dupixent )   Patient requested (Patient-Rptd) Delivery   Delivery date: 09/11/24   Verified address: (Patient-Rptd) [address] [place] [address]   Medication will be filled on 09/10/24.

## 2024-10-01 ENCOUNTER — Other Ambulatory Visit: Payer: Self-pay

## 2024-10-05 ENCOUNTER — Other Ambulatory Visit: Payer: Self-pay

## 2024-10-07 ENCOUNTER — Other Ambulatory Visit (HOSPITAL_COMMUNITY): Payer: Self-pay

## 2024-10-07 ENCOUNTER — Encounter (INDEPENDENT_AMBULATORY_CARE_PROVIDER_SITE_OTHER): Payer: Self-pay

## 2024-10-07 ENCOUNTER — Other Ambulatory Visit: Payer: Self-pay

## 2024-10-07 NOTE — Progress Notes (Signed)
 Specialty Pharmacy Refill Coordination Note  MyChart Questionnaire Submission  Meredith Burns is a 38 y.o. female contacted today regarding refills of specialty medication(s) Dupixent .  Doses on hand: (Patient-Rptd) 0   Injection date: (Patient-Rptd) 10/14/24  Patient requested: (Patient-Rptd) Delivery   Delivery date: 10/09/24  Verified address: [address] [place] [address]  Medication will be filled on 10/08/24.

## 2024-11-02 ENCOUNTER — Other Ambulatory Visit: Payer: Self-pay

## 2024-11-04 ENCOUNTER — Other Ambulatory Visit: Payer: Self-pay | Admitting: Pharmacy Technician

## 2024-11-04 ENCOUNTER — Other Ambulatory Visit: Payer: Self-pay

## 2024-11-04 NOTE — Progress Notes (Signed)
 Specialty Pharmacy Refill Coordination Note  Meredith Burns is a 38 y.o. female contacted today regarding refills of specialty medication(s) Dupilumab  (Dupixent )   Patient requested Delivery   Delivery date: 11/11/24   Verified address: [address]  [name]   Medication will be filled on: 11/10/24

## 2024-11-10 ENCOUNTER — Other Ambulatory Visit: Payer: Self-pay

## 2024-12-01 ENCOUNTER — Other Ambulatory Visit (HOSPITAL_COMMUNITY): Payer: Self-pay

## 2024-12-03 ENCOUNTER — Other Ambulatory Visit: Payer: Self-pay

## 2024-12-03 NOTE — Progress Notes (Signed)
 Specialty Pharmacy Refill Coordination Note  Meredith Burns is a 39 y.o. female contacted today regarding refills of specialty medication(s) Dupilumab  (Dupixent )   Patient requested Delivery   Delivery date: 12/10/24   Verified address: [address]  [place]   Medication will be filled on: 12/09/24

## 2024-12-09 ENCOUNTER — Other Ambulatory Visit: Payer: Self-pay

## 2024-12-28 ENCOUNTER — Other Ambulatory Visit: Payer: Self-pay

## 2024-12-30 ENCOUNTER — Other Ambulatory Visit (HOSPITAL_COMMUNITY): Payer: Self-pay

## 2024-12-30 ENCOUNTER — Other Ambulatory Visit: Payer: Self-pay

## 2024-12-30 NOTE — Progress Notes (Signed)
 Specialty Pharmacy Ongoing Clinical Assessment Note  Meredith Burns is a 39 y.o. female who is being followed by the specialty pharmacy service for RxSp Allergy    Patient's specialty medication(s) reviewed today: Dupilumab  (Dupixent )   Missed doses in the last 4 weeks: 0   Patient/Caregiver did not have any additional questions or concerns.   Therapeutic benefit summary: Patient is NOT achieving benefit   Adverse events/side effects summary: No adverse events/side effects   Patient's therapy is appropriate to: Continue    Goals Addressed             This Visit's Progress    Reduce signs and symptoms   Improving    Patient is not on track and improving. Patient will maintain adherence, adhere to provider and/or lab appointments, and be monitored by provider to determine if a change in treatment plan is warranted. Patient states that she is more controlled than she was, but medication seems to be wearing off and she starts getting itchy before the next injection is due. She plans to contact the office to ask about a dose change for better control.         Follow up: 12 months  [facility]

## 2024-12-30 NOTE — Progress Notes (Signed)
 Specialty Pharmacy Refill Coordination Note  Meredith Burns is a 39 y.o. female contacted today regarding refills of specialty medication(s) Dupilumab  (Dupixent )   Patient requested Delivery   Delivery date: 01/07/25   Verified address: [address]  [place]   Medication will be filled on: 01/06/25
